# Patient Record
Sex: Male | Born: 1975
Health system: Southern US, Community
[De-identification: ages and names within clinical notes are randomized; demographics above are authoritative.]

## PROBLEM LIST (undated history)

## (undated) DIAGNOSIS — R7302 Impaired glucose tolerance (oral): Secondary | ICD-10-CM

## (undated) DIAGNOSIS — I10 Essential (primary) hypertension: Secondary | ICD-10-CM

## (undated) DIAGNOSIS — E8881 Metabolic syndrome: Secondary | ICD-10-CM

## (undated) DIAGNOSIS — T7840XA Allergy, unspecified, initial encounter: Secondary | ICD-10-CM

## (undated) HISTORY — DX: Allergy, unspecified, initial encounter: T78.40XA

---
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
## 2004-09-18 ENCOUNTER — Ambulatory Visit: Payer: Self-pay | Admitting: Family Medicine

## 2005-02-06 ENCOUNTER — Ambulatory Visit: Payer: Self-pay | Admitting: Family Medicine

## 2005-05-17 ENCOUNTER — Ambulatory Visit: Payer: Self-pay | Admitting: Family Medicine

## 2005-10-15 ENCOUNTER — Ambulatory Visit: Payer: Self-pay | Admitting: Family Medicine

## 2006-07-01 ENCOUNTER — Ambulatory Visit: Payer: Self-pay | Admitting: Family Medicine

## 2006-10-15 ENCOUNTER — Encounter: Payer: Self-pay | Admitting: Family Medicine

## 2006-10-15 DIAGNOSIS — M79609 Pain in unspecified limb: Secondary | ICD-10-CM | POA: Insufficient documentation

## 2006-10-16 ENCOUNTER — Encounter: Admission: RE | Admit: 2006-10-16 | Discharge: 2006-10-16 | Payer: Self-pay | Admitting: Family Medicine

## 2007-05-14 ENCOUNTER — Ambulatory Visit: Payer: Self-pay | Admitting: Family Medicine

## 2007-10-14 ENCOUNTER — Ambulatory Visit: Payer: Self-pay | Admitting: Family Medicine

## 2008-03-09 ENCOUNTER — Ambulatory Visit: Payer: Self-pay | Admitting: Family Medicine

## 2008-03-09 DIAGNOSIS — J309 Allergic rhinitis, unspecified: Secondary | ICD-10-CM | POA: Insufficient documentation

## 2008-03-09 HISTORY — DX: Morbid (severe) obesity due to excess calories: E66.01

## 2008-03-17 ENCOUNTER — Ambulatory Visit: Payer: Self-pay | Admitting: Family Medicine

## 2008-03-17 DIAGNOSIS — R030 Elevated blood-pressure reading, without diagnosis of hypertension: Secondary | ICD-10-CM | POA: Insufficient documentation

## 2008-03-30 ENCOUNTER — Telehealth: Payer: Self-pay | Admitting: Family Medicine
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
## 2009-03-21 ENCOUNTER — Ambulatory Visit: Payer: Self-pay | Admitting: Family Medicine

## 2009-04-27 ENCOUNTER — Ambulatory Visit: Payer: Self-pay | Admitting: Sports Medicine

## 2010-06-13 ENCOUNTER — Encounter: Payer: Self-pay | Admitting: Family Medicine

## 2010-06-22 NOTE — Assessment & Plan Note (Signed)
Summary: Nausea/Diarrhea  Subjective:  Patient complains of onset today of nausea/vomiting and watery diarrhea, fatigue, myalgias, chills.  Able to take fluids.  No abdominal pain.  Assessment:   Suspect viral gastroenteritis  Plan:  Begin clear liquids (Pedialyte to start), then gradually advance. PROMETHAZINE HCL 25 MG TABS (PROMETHAZINE HCL) 1 by mouth q4 to 6hr as needed nausea Clinic visit if symptoms worsen.

## 2010-07-09 ENCOUNTER — Encounter: Payer: Self-pay | Admitting: *Deleted

## 2010-09-25 ENCOUNTER — Encounter: Payer: Self-pay | Admitting: Family Medicine

## 2010-09-25 ENCOUNTER — Ambulatory Visit (INDEPENDENT_AMBULATORY_CARE_PROVIDER_SITE_OTHER): Payer: Self-pay | Admitting: Family Medicine

## 2010-09-25 VITALS — BP 138/87 | HR 96 | Temp 97.7°F | Ht 72.0 in | Wt 247.0 lb

## 2010-09-25 DIAGNOSIS — M549 Dorsalgia, unspecified: Secondary | ICD-10-CM
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
## 2010-09-25 NOTE — Patient Instructions (Signed)
You have musculoskeletal low back pain Take tylenol for baseline pain relief (1-2 extra strength tabs 3x/day) Aleve 1-2 tabs twice a day with food for pain and inflammation (if you do not have stomach or kidney issues). Consider flexeril as needed for muscle spasms at bedtime (no driving on this medicine). Stay as active as possible. Do home exercises and stretches as directed - hold each for 20-30 seconds and do each one three times. Consider massage, chiropractor, physical therapy, and/or acupuncture. Physical therapy has been shown to be helpful while the others have mixed results. Strengthening of low back muscles, abdominal musculature are key for long term pain relief. If not improving, will consider further imaging (x-rays, MRI) and/or other medications (neurontin, lyrica, nortriptyline) that help with pain.

## 2011-05-07 ENCOUNTER — Encounter: Payer: Self-pay | Admitting: Family Medicine

## 2011-05-15 ENCOUNTER — Ambulatory Visit (INDEPENDENT_AMBULATORY_CARE_PROVIDER_SITE_OTHER): Payer: Commercial Managed Care - PPO | Admitting: Family Medicine

## 2011-05-15 ENCOUNTER — Encounter: Payer: Self-pay | Admitting: Family Medicine

## 2011-05-15 VITALS — BP 120/74 | HR 86 | Resp 18 | Ht 70.5 in | Wt 247.1 lb

## 2011-05-15 DIAGNOSIS — R030 Elevated blood-pressure reading, without diagnosis of hypertension: Secondary | ICD-10-CM

## 2011-05-15 DIAGNOSIS — E669 Obesity, unspecified: Secondary | ICD-10-CM

## 2011-05-15 DIAGNOSIS — J309 Allergic rhinitis, unspecified: Secondary | ICD-10-CM

## 2011-05-15 DIAGNOSIS — T7840XA Allergy, unspecified, initial encounter: Secondary | ICD-10-CM

## 2011-05-15 MED ORDER — FLUTICASONE PROPIONATE 50 MCG/ACT NA SUSP: 2.0000 | Freq: Every day | NASAL | Status: DC

## 2011-05-15 NOTE — Patient Instructions (Addendum)
F/U in 6 months. Weight loss goal of 2 pound per month or a little more Fasting CBc, chem 7, lipid, HBa1C and TSH and Vitamin D   It is important that you exercise regularly at least 5 times a week. If you develop chest pain, have severe difficulty breathing, or feel very tired, stop exercising immediately and seek medical attention   A healthy diet is rich in fruit, vegetables and whole grains. Poultry fish, nuts and beans are a healthy choice for protein rather then red meat. A low sodium diet and drinking 64 ounces of water daily is generally recommended. Oils and sweet should be limited. Carbohydrates especially for those who are diabetic or overweight, should be limited to 34-45 gram per meal. It is important to eat on a regular schedule, at least 3 times daily. Snacks should be primarily fruits, vegetables or nuts.   You will be referred to the dietitian  Pls log into "my fitness pal" and use it  Cut out the sweet expect in fresh fruit.   Cultivate a social life.  All the best!!   Sleep goal of 6.5 to 8 hrs each night  Call in for zyrtec. I will precribe flonase.  PLS Fasting labs in the next 3 weeks

## 2011-05-20 NOTE — Assessment & Plan Note (Signed)
Fairly stable. Pt to call in for oral med, zyrtec or claritin, and he will have flonase prescribed

## 2011-05-20 NOTE — Assessment & Plan Note (Signed)
Deteriorated. Patient re-educated about  the importance of commitment to a  minimum of 150 minutes of exercise per week. The importance of healthy food choices with portion control discussed. Encouraged to start a food diary, count calories and to consider  joining a support group. Sample diet sheets offered. Goals set by the patient for the next several months.    

## 2011-05-20 NOTE — Assessment & Plan Note (Signed)
Normotensive at this visit. Pt to commit to DASH diet and regular exercise

## 2011-05-20 NOTE — Progress Notes (Signed)
  Subjective:    Patient ID: Darryl Christensen, male    DOB: 15-Nov-1975, 36 y.o.   MRN: [id number]  HPI The PT is here for follow up and re-evaluation of chronic medical conditions, medication management and review of any available recent lab and radiology data.  Preventive health is updated, specifically  Cancer screening and Immunization.   Questions or concerns regarding consultations or procedures which the PT has had in the interim are  addressed. The PT denies any adverse reactions to current medications since the last visit.  He reports poor work life balance, recently spoke with a life coach and plans to make changes. No regular exercise and poor eating habits have resulted in weight gain. Labs are still outstanding. Allergies are fairly stable though he does need medication       Review of Systems See HPI Denies recent fever or chills. Denies sinus pressure, nasal congestion, ear pain or sore throat. Denies chest congestion, productive cough or wheezing. Denies chest pains, palpitations and leg swelling Denies abdominal pain, nausea, vomiting,diarrhea or constipation.   Denies dysuria, frequency, hesitancy or incontinence. Denies joint pain, swelling and limitation in mobility. Denies headaches, seizures, numbness, or tingling. Denies depression, anxiety or insomnia. Denies skin break down or rash.        Objective:   Physical Exam Patient alert and oriented and in no cardiopulmonary distress.  HEENT: No facial asymmetry, EOMI, no sinus tenderness,  oropharynx pink and moist.  Neck supple no adenopathy.  Chest: Clear to auscultation bilaterally.  CVS: S1, S2 no murmurs, no S3.  ABD: Soft non tender. Bowel sounds normal.  Ext: No edema  MS: Adequate ROM spine, shoulders, hips and knees.  Skin: Intact, no ulcerations or rash noted.  Psych: Good eye contact, normal affect. Memory intact not anxious or depressed appearing.  CNS: CN 2-12 intact, power, tone  and sensation normal throughout.        Assessment & Plan:

## 2011-11-13 ENCOUNTER — Ambulatory Visit: Payer: Commercial Managed Care - PPO | Admitting: Family Medicine

## 2012-01-24 ENCOUNTER — Ambulatory Visit: Payer: Commercial Managed Care - PPO | Admitting: Family Medicine

## 2012-03-20 ENCOUNTER — Ambulatory Visit: Payer: Commercial Managed Care - PPO | Admitting: Family Medicine

## 2012-05-12 ENCOUNTER — Ambulatory Visit: Payer: Commercial Managed Care - PPO | Admitting: Family Medicine

## 2012-06-26 ENCOUNTER — Ambulatory Visit: Payer: Commercial Managed Care - PPO | Admitting: Family Medicine

## 2012-08-19 ENCOUNTER — Ambulatory Visit: Payer: Commercial Managed Care - PPO | Admitting: Family Medicine

## 2012-08-21 ENCOUNTER — Ambulatory Visit: Payer: Commercial Managed Care - PPO | Admitting: Family Medicine

## 2012-09-11 ENCOUNTER — Ambulatory Visit (INDEPENDENT_AMBULATORY_CARE_PROVIDER_SITE_OTHER): Payer: Commercial Managed Care - PPO | Admitting: Family Medicine

## 2012-09-11 ENCOUNTER — Encounter: Payer: Self-pay | Admitting: Family Medicine

## 2012-09-11 VITALS — BP 124/84 | HR 84 | Resp 16 | Ht 70.5 in | Wt 254.0 lb

## 2012-09-11 DIAGNOSIS — Z139 Encounter for screening, unspecified: Secondary | ICD-10-CM

## 2012-09-11 DIAGNOSIS — E669 Obesity, unspecified: Secondary | ICD-10-CM

## 2012-09-11 DIAGNOSIS — T7840XA Allergy, unspecified, initial encounter: Secondary | ICD-10-CM

## 2012-09-11 NOTE — Patient Instructions (Addendum)
F/u in 4 month  Weight loss goal of 4 pounds per month  Please locate a nutritionist of your choice, if you need a referral send me a message We will check [place] for you  Work life balance as well  CBC, lipid, chem 7, TSH, hBA1C  Fasting as soon as possible  It is important that you exercise regularly at least 30 minutes 5 times a week. If you develop chest pain, have severe difficulty breathing, or feel very tired, stop exercising immediately and seek medical attention   Rash on left forearm is where the scab is healing, please stop traumatizing the area

## 2012-09-11 NOTE — Progress Notes (Signed)
  Subjective:    Patient ID: Darryl Christensen, male    DOB: 11/18/1975, 37 y.o.   MRN: [id number]  HPI Pt in for follow up.  States allergies are well controlled with zyrtec Work life balance still does not exist, eating habits are unchanged, with no commitment to exercise.  Believe s he will benefit from a nutritionist helping him, and he has access to dance classes at different work sites which he will look into. Understands the need to be aggressive about lifestyle change to improve his health, has gained 20 pounds in the past approx 5 years   Review of Systems See HPI Denies recent fever or chills. Denies sinus pressure, nasal congestion, ear pain or sore throat. Denies chest congestion, productive cough or wheezing. Denies chest pains, palpitations and leg swelling Denies abdominal pain, nausea, vomiting,diarrhea or constipation.   Denies dysuria, frequency, hesitancy or incontinence. Denies joint pain, swelling and limitation in mobility. Denies headaches, seizures, numbness, or tingling. Denies depression, anxiety or insomnia. C/o hyperpigmented pruritic area on his left forearm which he has been "picking at" Was initially a bite, no purulent drainage from same        Objective:   Physical Exam  Patient alert and oriented and in no cardiopulmonary distress.  HEENT: No facial asymmetry, EOMI,  oropharynx  and moist.  Neck supple no adenopathy.  Chest: Clear to auscultation bilaterally.  CVS: S1, S2 no murmurs, no S3.  ABD: Soft non tender. Bowel sounds normal.  Ext: No edema  MS: Adequate ROM spine, shoulders, hips and knees.  Skin: Intact, hyperpigmented lesion macular rash  left forearm due to repeated trauma  Psych: Good eye contact, normal affect. Memory intact not anxious or depressed appearing.  CNS: CN 2-12 intact, power,  normal throughout.       Assessment & Plan:

## 2012-09-14 NOTE — Assessment & Plan Note (Signed)
Well controlled with zyrtec, continue same

## 2012-09-14 NOTE — Assessment & Plan Note (Signed)
Deteriorated. Patient re-educated about  the importance of commitment to a  minimum of 150 minutes of exercise per week. The importance of healthy food choices with portion control discussed. Encouraged to start a food diary, count calories and to consider  joining a support group. Sample diet sheets offered. Goals set by the patient for the next several months.    

## 2012-11-19 ENCOUNTER — Ambulatory Visit: Payer: Self-pay | Admitting: Family Medicine

## 2012-12-05 ENCOUNTER — Ambulatory Visit: Payer: Self-pay | Admitting: Family Medicine

## 2013-01-22 ENCOUNTER — Ambulatory Visit: Payer: Commercial Managed Care - PPO | Admitting: Family Medicine

## 2013-02-12 ENCOUNTER — Encounter (INDEPENDENT_AMBULATORY_CARE_PROVIDER_SITE_OTHER): Payer: Self-pay

## 2013-02-12 ENCOUNTER — Encounter: Payer: Self-pay | Admitting: Family Medicine

## 2013-02-12 ENCOUNTER — Ambulatory Visit (INDEPENDENT_AMBULATORY_CARE_PROVIDER_SITE_OTHER): Payer: Commercial Managed Care - PPO | Admitting: Family Medicine

## 2013-02-12 VITALS — BP 132/84 | HR 90 | Resp 18 | Ht 70.5 in | Wt 247.0 lb

## 2013-02-12 DIAGNOSIS — M549 Dorsalgia, unspecified: Secondary | ICD-10-CM

## 2013-02-12 DIAGNOSIS — J309 Allergic rhinitis, unspecified: Secondary | ICD-10-CM

## 2013-02-12 DIAGNOSIS — Z139 Encounter for screening, unspecified: Secondary | ICD-10-CM

## 2013-02-12 DIAGNOSIS — E669 Obesity, unspecified: Secondary | ICD-10-CM

## 2013-02-12 NOTE — Progress Notes (Signed)
  Subjective:    Patient ID: Darryl Christensen, male    DOB: 1975/11/17, 37 y.o.   MRN: [id number]  HPI The PT is here for follow up and re-evaluation of chronic medical conditions, medication management and review of any available recent lab and radiology data.  Preventive health is updated, specifically   Immunization. Has had flu vaccine, tetanus status still needs to be verified, feels he got this through employee health  Questions or concerns regarding consultations or procedures which the PT has had in the interim are  Addressed.He has been to nutritionist , and has worked on changing eating habits, as well as commitment to daily physical activity. 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 Also has improved stress management , allocating more duties, relying on relationships he is developing, listening to inspirational talks and music for stress relief. The PT denies any need for chronic meds, including allergy meds.  h/o LBP  To right leg x 4 month, denies numbness or  Weakness in the leg, symptoms are from buttock to calf, denies  or incontinence. Symptoms occur after prolonged sitting eg 1 hour car ride, he does feel that they are less since weight loss, opting to hold on imaging study at this time     Review of Systems See HPI Denies recent fever or chills. Denies sinus pressure, nasal congestion, ear pain or sore throat. Denies chest congestion, productive cough or wheezing. Denies chest pains, palpitations and leg swelling Denies abdominal pain, nausea, vomiting,diarrhea or constipation.   Denies dysuria, frequency, hesitancy or incontinence. Denies joint pain, swelling and limitation in mobility. Denies headaches, seizures,  Denies depression, anxiety or insomnia. Denies skin break down or rash.        Objective:   Physical Exam  Patient alert and oriented and in no cardiopulmonary  distress.  HEENT: No facial asymmetry, EOMI, no sinus tenderness,  oropharynx pink and moist.  Neck supple no adenopathy.  Chest: Clear to auscultation bilaterally.  CVS: S1, S2 no murmurs, no S3.  ABD: Soft non tender.   Ext: No edema  MS: Adequate ROM spine, shoulders, hips and knees.  Skin: Intact, no ulcerations or rash noted.  Psych: Good eye contact, normal affect. Memory intact not anxious or depressed appearing.  CNS: CN 2-12 intact, power, and sensation normal throughout.       Assessment & Plan:

## 2013-02-12 NOTE — Patient Instructions (Addendum)
F/U in  4 month, call if you need me before   It is important that you exercise regularly at least 30 minutes 5 times a week. If you develop chest pain, have severe difficulty breathing, or feel very tired, stop exercising immediately and seek medical attention   Continue to work on weight loss , you are doing very well  CBC, chem 7, lipid, HBA1C, TSH , vit D

## 2013-02-13 ENCOUNTER — Ambulatory Visit: Payer: Self-pay | Admitting: Family Medicine

## 2013-02-14 NOTE — Assessment & Plan Note (Signed)
Improved. Pt applauded on succesful weight loss through lifestyle change, and encouraged to continue same. Weight loss goal set for the next several months.  
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
## 2013-02-14 NOTE — Assessment & Plan Note (Signed)
No current or recent flare, no need for maintenance med at this time

## 2013-03-07 ENCOUNTER — Ambulatory Visit: Payer: Self-pay | Admitting: Family Medicine

## 2013-06-18 ENCOUNTER — Encounter: Payer: Self-pay | Admitting: Family Medicine

## 2013-06-18 ENCOUNTER — Encounter (INDEPENDENT_AMBULATORY_CARE_PROVIDER_SITE_OTHER): Payer: Self-pay

## 2013-06-18 ENCOUNTER — Ambulatory Visit (INDEPENDENT_AMBULATORY_CARE_PROVIDER_SITE_OTHER): Payer: Commercial Managed Care - PPO | Admitting: Family Medicine

## 2013-06-18 VITALS — BP 120/80 | HR 98 | Resp 18 | Ht 70.5 in | Wt 249.0 lb

## 2013-06-18 DIAGNOSIS — F919 Conduct disorder, unspecified: Secondary | ICD-10-CM

## 2013-06-18 DIAGNOSIS — J309 Allergic rhinitis, unspecified: Secondary | ICD-10-CM

## 2013-06-18 DIAGNOSIS — R4689 Other symptoms and signs involving appearance and behavior: Secondary | ICD-10-CM

## 2013-06-18 DIAGNOSIS — E669 Obesity, unspecified: Secondary | ICD-10-CM

## 2013-06-18 MED ORDER — CETIRIZINE HCL 10 MG PO TABS: 10.0000 mg | ORAL_TABLET | Freq: Every day | ORAL | Status: DC

## 2013-06-18 MED ORDER — FLUTICASONE PROPIONATE 50 MCG/ACT NA SUSP: 2.0000 | Freq: Every day | NASAL | Status: DC

## 2013-06-18 NOTE — Patient Instructions (Signed)
F/U in 4 months, call if you need me before  It is important that you exercise regularly at least 30 minutes 7 times a week. If you develop chest pain, have severe difficulty breathing, or feel very tired, stop exercising immediately and seek medical attention    A healthy diet is rich in fruit, vegetables and whole grains. Poultry fish, nuts and beans are a healthy choice for protein rather then red meat. A low sodium diet and drinking 64 ounces of water daily is generally recommended. Oils and sweet should be limited. Carbohydrates especially for those who are diabetic or overweight, should be limited to 45 to 60 gram per meal. It is important to eat on a regular schedule, at least 3 times daily. Snacks should be primarily fruits, vegetables or nuts.  Adequate rest, generally 6 to 8 hours per night is important for good health.Good sleep hygiene involves setting a regular bedtime, and turning off all sound and light in your sleep environment.Limiting caffeine intake will also help with the ability to rest well  LABS this week  Congrats on weight loss, ad please count calories   All medications need to be brought to every visit  Allergy meds are sent in  Definitely plan 1 week vacations and take them , and also once weekly fun times

## 2013-06-21 DIAGNOSIS — R4689 Other symptoms and signs involving appearance and behavior: Secondary | ICD-10-CM | POA: Insufficient documentation

## 2013-06-21 NOTE — Assessment & Plan Note (Signed)
Labs have been requested for over 1 year, still not done. Pt states he will have them done in next week. Needs them, I suspect he is likely prediabetic with metabolic syndrome , and this needs to be identified and dealt with in aggressive fashion sooner rather than later Will send a personal reminder of importance of labs

## 2013-06-21 NOTE — Assessment & Plan Note (Signed)
Currently becoming symptomatic, meds prescribed

## 2013-06-21 NOTE — Progress Notes (Signed)
   Subjective:    Patient ID: Darryl Christensen, male    DOB: 07/13/1975, 38 y.o.   MRN: [id number]  HPI The PT is here for follow up and re-evaluation of chronic medical conditions, medication management and review of agree ny available recent lab and radiology data. Labs are still not done! Preventive health is updated, specifically  Cancer screening and Immunization.  Reports having had his TdaP through employee health , is to get back to me with the info Questions or concerns regarding consultations or procedures which the PT has had in the interim are  Addressed.Has enjoyed sessions with nutritionist, was counting carbs and is still counting steps with a pedometer, aims for 1000 per day, sometimes falls short. Still working on work [employer]/life balance, not much time off or social life, hte good thing is that he absolutely enjoys his work , but does agree that he needs to relax more The PT denies any adverse reactions to current medications since the last visit.  There are no new concerns.  There are no specific complaints increased nasal stuffiness and post nasal drip in past 2 days, no fever or chills, has seasonal allergies , generally in the Spring, but temp differences during this Winter  is playing its part also Back and lower leg pain has resolved     Review of Systems See HPI Denies recent fever or chills. Denies sore throat , ear pain  Denies chest congestion, productive cough or wheezing. Denies chest pains, palpitations and leg swelling Denies abdominal pain, nausea, vomiting,diarrhea or constipation.   Denies dysuria, frequency, hesitancy or incontinence. Denies joint pain, swelling and limitation in mobility. Denies headaches, seizures, numbness, or tingling. Denies depression, anxiety or insomnia. Denies skin break down or rash.        Objective:   Physical Exam  Patient alert and oriented and in no cardiopulmonary distress.  HEENT: No facial asymmetry, EOMI, no  sinus tenderness,  oropharynx pink and moist.  Neck supple no adenopathy.  Chest: Clear to auscultation bilaterally.  CVS: S1, S2 no murmurs, no S3.  ABD: Soft non tender. Bowel sounds normal.  Ext: No edema  MS: Adequate ROM spine, shoulders, hips and knees.  Skin: Intact, no ulcerations or rash noted.  Psych: Good eye contact, normal affect. Memory intact not anxious or depressed appearing.  CNS: CN 2-12 intact, power, tone and sensation normal throughout.       Assessment & Plan:  Obesity (BMI 30.0-34.9) Deteriorated since last visit , but improved since May 2014. May need appetite suppressant, will offer after labs are obtained Patient re-educated about  the importance of commitment to a  minimum of 150 minutes of exercise per week. The importance of healthy food choices with portion control discussed. Encouraged to start a food diary, count calories and to consider  joining a support group. Sample diet sheets offered. Goals set by the patient for the next several months.     Allergic rhinitis Currently becoming symptomatic, meds prescribed  Non-compliant behavior Labs have been requested for over 1 year, still not done. Pt states he will have them done in next week. Needs them, I suspect he is likely prediabetic with metabolic syndrome , and this needs to be identified and dealt with in aggressive fashion sooner rather than later Will send a personal reminder of importance of labs

## 2013-06-21 NOTE — Assessment & Plan Note (Addendum)
Deteriorated since last visit , but improved since May 2014. May need appetite suppressant, will offer after labs are obtained Patient re-educated about  the importance of commitment to a  minimum of 150 minutes of exercise per week. The importance of healthy food choices with portion control discussed. Encouraged to start a food diary, count calories and to consider  joining a support group. Sample diet sheets offered. Goals set by the patient for the next several months. Has been doing better ,  As far as attempts to focus on lifestyle modification, really would benefit from personal coach and I may need to also offer this as another management strategy, more frequest visits to nutritionist or in the office if he starts appetite suppressant

## 2013-07-03 ENCOUNTER — Ambulatory Visit: Payer: Self-pay | Admitting: Family Medicine

## 2013-07-05 ENCOUNTER — Ambulatory Visit: Payer: Self-pay | Admitting: Family Medicine

## 2013-10-15 ENCOUNTER — Ambulatory Visit: Payer: Self-pay | Admitting: Family Medicine

## 2013-11-04 ENCOUNTER — Ambulatory Visit: Payer: Self-pay | Admitting: Family Medicine

## 2013-11-12 ENCOUNTER — Ambulatory Visit: Payer: Commercial Managed Care - PPO | Admitting: Family Medicine

## 2013-12-30 ENCOUNTER — Ambulatory Visit: Payer: Commercial Managed Care - PPO | Admitting: Family Medicine

## 2014-01-06 ENCOUNTER — Ambulatory Visit: Payer: Self-pay | Admitting: Family Medicine

## 2014-02-03 ENCOUNTER — Ambulatory Visit: Payer: Commercial Managed Care - PPO | Admitting: Family Medicine

## 2014-02-04 ENCOUNTER — Ambulatory Visit: Payer: Self-pay | Admitting: Family Medicine

## 2014-02-22 ENCOUNTER — Ambulatory Visit: Payer: Commercial Managed Care - PPO | Admitting: Family Medicine

## 2014-04-06 ENCOUNTER — Ambulatory Visit: Payer: Self-pay | Admitting: Family Medicine

## 2014-05-07 ENCOUNTER — Ambulatory Visit: Payer: Self-pay | Admitting: Family Medicine

## 2014-07-07 ENCOUNTER — Ambulatory Visit
Admit: 2014-07-07 | Disposition: A | Discharge status: Home / Self Care | Payer: Self-pay | Attending: Family Medicine | Admitting: Family Medicine

## 2014-08-06 ENCOUNTER — Ambulatory Visit
Admit: 2014-08-06 | Disposition: A | Discharge status: Home / Self Care | Payer: Self-pay | Attending: Family Medicine | Admitting: Family Medicine

## 2014-09-24 ENCOUNTER — Ambulatory Visit: Payer: Commercial Managed Care - PPO | Admitting: Dietician

## 2016-03-13 DIAGNOSIS — H524 Presbyopia: Secondary | ICD-10-CM | POA: Diagnosis not present

## 2016-03-13 DIAGNOSIS — H33301 Unspecified retinal break, right eye: Secondary | ICD-10-CM | POA: Diagnosis not present

## 2016-03-13 DIAGNOSIS — H52223 Regular astigmatism, bilateral: Secondary | ICD-10-CM | POA: Diagnosis not present

## 2016-03-13 DIAGNOSIS — H5213 Myopia, bilateral: Secondary | ICD-10-CM | POA: Diagnosis not present

## 2016-03-13 DIAGNOSIS — H1045 Other chronic allergic conjunctivitis: Secondary | ICD-10-CM | POA: Diagnosis not present

## 2016-03-13 DIAGNOSIS — H16223 Keratoconjunctivitis sicca, not specified as Sjogren's, bilateral: Secondary | ICD-10-CM | POA: Diagnosis not present

## 2016-04-06 ENCOUNTER — Encounter: Payer: Commercial Managed Care - PPO | Attending: Family Medicine | Admitting: Dietician

## 2016-04-06 VITALS — Ht 71.0 in | Wt 255.8 lb

## 2016-04-06 DIAGNOSIS — Z6835 Body mass index (BMI) 35.0-35.9, adult: Secondary | ICD-10-CM

## 2016-04-06 DIAGNOSIS — E6609 Other obesity due to excess calories: Secondary | ICD-10-CM

## 2016-04-06 DIAGNOSIS — E669 Obesity, unspecified: Secondary | ICD-10-CM | POA: Diagnosis not present

## 2016-04-06 DIAGNOSIS — Z713 Dietary counseling and surveillance: Secondary | ICD-10-CM | POA: Diagnosis not present

## 2016-04-06 NOTE — Progress Notes (Signed)
Notes from [facility] [facility] employee "self referral" nutrition session: Start time: 0900   End time: 1000  Met with employee to discuss his/her nutritional concerns and diet history.  Current eating pattern: Breakfast yogurt with granola; milkshake Lunch  Sandwich, chips Supper varies, often out Snacks often throughout the day   The employee's questions/concerns were also addressed. He seeks help with controlling snack choices and frequency, healthier breakfast options, overall more mindful eating.   We discussed the following topics:  Healthy Eating-- snack options, balanced nutrition for meals, breakfast ideas, importance of including protein source in smoothies, viewed "meal in a jar" websites for portable meal options.   Weight Concerns--reviewed benefits of tracking food intake, mindful vs. Mindless eating   I also provided the following handouts as reinforcement of the educational session: Build A Balanced Breakfast   Additional Comments: Will send additional recipes to patient by email. Will follow-up by email in about 1 month.     Goals Agreed Upon: Patient will reduce frequency of snacking and make lower-calorie, more nutritious choices. He will plan to resume tracking his food intake. He will eat balanced breakfast meals.

## 2016-04-09 ENCOUNTER — Encounter: Payer: Self-pay | Admitting: Dietician

## 2017-07-02 ENCOUNTER — Telehealth: Payer: Self-pay | Admitting: Family Medicine

## 2017-07-02 DIAGNOSIS — Z13220 Encounter for screening for lipoid disorders: Secondary | ICD-10-CM

## 2017-07-02 DIAGNOSIS — Z1159 Encounter for screening for other viral diseases: Secondary | ICD-10-CM

## 2017-07-02 DIAGNOSIS — E669 Obesity, unspecified: Secondary | ICD-10-CM

## 2017-07-02 NOTE — Telephone Encounter (Signed)
Has not been here since 06/2013 but requesting appt tomorrow. I didn't know how to handle the situation so I am forwarding to you.

## 2017-07-02 NOTE — Telephone Encounter (Signed)
Patient is requesting an appointment tomorrow with Dr.Simpson if possible. Cb#: [phone]

## 2017-07-03 NOTE — Telephone Encounter (Signed)
Pls order HIV, CBC, fasting lipid, cmp and EGFR, and TSH and vit D, needs them before he comes in, appt is being scheduled

## 2017-07-03 NOTE — Telephone Encounter (Signed)
What labs does he need for his visit?

## 2017-07-03 NOTE — Telephone Encounter (Signed)
Ordered

## 2017-07-15 ENCOUNTER — Encounter: Payer: Commercial Managed Care - PPO | Admitting: Family Medicine

## 2017-07-31 ENCOUNTER — Ambulatory Visit: Payer: 59 | Admitting: Family Medicine

## 2017-07-31 ENCOUNTER — Encounter: Payer: Self-pay | Admitting: Family Medicine

## 2017-07-31 ENCOUNTER — Ambulatory Visit (INDEPENDENT_AMBULATORY_CARE_PROVIDER_SITE_OTHER): Payer: 59 | Admitting: Family Medicine

## 2017-07-31 VITALS — BP 142/92 | HR 71 | Resp 16 | Ht 71.0 in | Wt 262.0 lb

## 2017-07-31 DIAGNOSIS — E669 Obesity, unspecified: Secondary | ICD-10-CM | POA: Diagnosis not present

## 2017-07-31 DIAGNOSIS — E785 Hyperlipidemia, unspecified: Secondary | ICD-10-CM | POA: Diagnosis not present

## 2017-07-31 DIAGNOSIS — Z23 Encounter for immunization: Secondary | ICD-10-CM | POA: Diagnosis not present

## 2017-07-31 DIAGNOSIS — J302 Other seasonal allergic rhinitis: Secondary | ICD-10-CM

## 2017-07-31 DIAGNOSIS — R030 Elevated blood-pressure reading, without diagnosis of hypertension: Secondary | ICD-10-CM

## 2017-07-31 DIAGNOSIS — Z1159 Encounter for screening for other viral diseases: Secondary | ICD-10-CM | POA: Diagnosis not present

## 2017-07-31 DIAGNOSIS — Z13220 Encounter for screening for lipoid disorders: Secondary | ICD-10-CM | POA: Diagnosis not present

## 2017-07-31 NOTE — Patient Instructions (Addendum)
Annual exam in 3 months, call if you need me before  Blood pressure high today, losing weight can lower  Blood pressure, it is 142/90 today Normal is less than 120/80  Change in behavior needed so that you can ENJOY  Improved health and aLL the very good things in life that you deserve and are in your lap!  YOU CAN DO ALL OF THIS and we are here to help you!!!  PLEASE SIGN up with my chart today!!  Please get labs today   Please start enjoying music and dance classes nearby regularly  Turn off work phone when not working and ABSOLUTELY plan and enjoy down times  You are referred to nutritionist for help with meal plans to help with weight loss     Hypertension Hypertension is another name for high blood pressure. High blood pressure forces your heart to work harder to pump blood. This can cause problems over time. There are two numbers in a blood pressure reading. There is a top number (systolic) over a bottom number (diastolic). It is best to have a blood pressure below 120/80. Healthy choices can help lower your blood pressure. You may need medicine to help lower your blood pressure if:  Your blood pressure cannot be lowered with healthy choices.  Your blood pressure is higher than 130/80.  Follow these instructions at home: Eating and drinking  If directed, follow the DASH eating plan. This diet includes: ? Filling half of your plate at each meal with fruits and vegetables. ? Filling one quarter of your plate at each meal with whole grains. Whole grains include whole wheat pasta, brown rice, and whole grain bread. ? Eating or drinking low-fat dairy products, such as skim milk or low-fat yogurt. ? Filling one quarter of your plate at each meal with low-fat (lean) proteins. Low-fat proteins include fish, skinless chicken, eggs, beans, and tofu. ? Avoiding fatty meat, cured and processed meat, or chicken with skin. ? Avoiding premade or processed food.  Eat less than 1,500  mg of salt (sodium) a day.  Limit alcohol use to no more than 1 drink a day for nonpregnant women and 2 drinks a day for men. One drink equals 12 oz of beer, 5 oz of wine, or 1 oz of hard liquor. Lifestyle  Work with your doctor to stay at a healthy weight or to lose weight. Ask your doctor what the best weight is for you.  Get at least 30 minutes of exercise that causes your heart to beat faster (aerobic exercise) most days of the week. This may include walking, swimming, or biking.  Get at least 30 minutes of exercise that strengthens your muscles (resistance exercise) at least 3 days a week. This may include lifting weights or pilates.  Do not use any products that contain nicotine or tobacco. This includes cigarettes and e-cigarettes. If you need help quitting, ask your doctor.  Check your blood pressure at home as told by your doctor.  Keep all follow-up visits as told by your doctor. This is important. Medicines  Take over-the-counter and prescription medicines only as told by your doctor. Follow directions carefully.  Do not skip doses of blood pressure medicine. The medicine does not work as well if you skip doses. Skipping doses also puts you at risk for problems.  Ask your doctor about side effects or reactions to medicines that you should watch for. Contact a doctor if:  You think you are having a reaction to the medicine  you are taking.  You have headaches that keep coming back (recurring).  You feel dizzy.  You have swelling in your ankles.  You have trouble with your vision. Get help right away if:  You get a very bad headache.  You start to feel confused.  You feel weak or numb.  You feel faint.  You get very bad pain in your: ? Chest. ? Belly (abdomen).  You throw up (vomit) more than once.  You have trouble breathing. Summary  Hypertension is another name for high blood pressure.  Making healthy choices can help lower blood pressure. If your  blood pressure cannot be controlled with healthy choices, you may need to take medicine. This information is not intended to replace advice given to you by your health care provider. Make sure you discuss any questions you have with your health care provider. Document Released: 10/10/2007 Document Revised: 03/21/2016 Document Reviewed: 03/21/2016 Elsevier Interactive Patient Education  [employer].

## 2017-08-01 DIAGNOSIS — Z23 Encounter for immunization: Secondary | ICD-10-CM | POA: Diagnosis not present

## 2017-08-01 DIAGNOSIS — E785 Hyperlipidemia, unspecified: Secondary | ICD-10-CM | POA: Diagnosis not present

## 2017-08-01 DIAGNOSIS — J302 Other seasonal allergic rhinitis: Secondary | ICD-10-CM | POA: Diagnosis not present

## 2017-08-01 DIAGNOSIS — R030 Elevated blood-pressure reading, without diagnosis of hypertension: Secondary | ICD-10-CM | POA: Diagnosis not present

## 2017-08-01 DIAGNOSIS — E669 Obesity, unspecified: Secondary | ICD-10-CM | POA: Diagnosis not present

## 2017-08-02 ENCOUNTER — Encounter: Payer: Self-pay | Admitting: Family Medicine

## 2017-08-04 ENCOUNTER — Encounter: Payer: Self-pay | Admitting: Family Medicine

## 2017-08-04 DIAGNOSIS — E785 Hyperlipidemia, unspecified: Secondary | ICD-10-CM | POA: Insufficient documentation

## 2017-08-04 DIAGNOSIS — I10 Essential (primary) hypertension: Secondary | ICD-10-CM

## 2017-08-04 DIAGNOSIS — Z23 Encounter for immunization: Secondary | ICD-10-CM | POA: Insufficient documentation

## 2017-08-04 HISTORY — DX: Essential (primary) hypertension: I10

## 2017-08-04 NOTE — Assessment & Plan Note (Signed)
Hyperlipidemia:Low fat diet discussed and encouraged.   Lipid Panel  Lab Results  Component Value Date   CHOL 194 07/31/2017   HDL 44 07/31/2017   LDLCALC 127 (H) 07/31/2017   TRIG 118 07/31/2017   CHOLHDL 4.4 07/31/2017

## 2017-08-04 NOTE — Assessment & Plan Note (Signed)
No current flare 

## 2017-08-04 NOTE — Progress Notes (Signed)
Darryl Christensen     MRN: [id number]      DOB: 08/31/1975   HPI Darryl Christensen is hereto re establish care, last seen just under 3 years ago, says "It's time!" C/o weight gain and poor eating habits with insufficient exerciseds Works too much although he absolutely enjoys it, but he also enjoys winding down and having fun, and has not allowed himself sufficient time for this. 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 Does not smoke and never has . Needs TdAP and is now at age 42 , willing to "take the plunge " and even understands the need to get labs today! Wishes to be referred to nutritionist whop he worked well with in the past Does report getting elevated BP reading in the supermarket, which lowered after 3rd reading when he was " less anxious" bu were still above the normal range  ROS Denies recent fever or chills. Denies sinus pressure, nasal congestion, ear pain or sore throat.Does have a h/o seasonal allergies but not experiencing a flare currently  Denies chest congestion, productive cough or wheezing. Denies chest pains, palpitations and leg swelling Denies abdominal pain, nausea, vomiting,diarrhea or constipation. No change in stool  Denies dysuria, frequency, hesitancy or incontinence. Denies joint pain, swelling and limitation in mobility. Denies headaches, seizures, numbness, or tingling. Denies depression, anxiety or insomnia. Denies skin break down c/o few hyperpigmented lesions on his forearm where he has been picking/ scratching his skin, wonders if he is diabetic   PE  BP (!) 142/92   Pulse 71   Resp 16   Ht 5\' 11"   (1.803 m)   Wt 262 lb (118.8 kg)   SpO2 98%   BMI 36.54 kg/m   Patient alert and oriented and in no cardiopulmonary distress.  HEENT: No facial asymmetry, EOMI,   oropharynx pink and moist.  Neck supple no JVD, no mass.  Chest: Clear to auscultation bilaterally.  CVS: S1, S2 no murmurs, no S3.Regular rate.  ABD: Soft non tender.   Ext: No edema  MS: Adequate ROM spine, shoulders, hips and knees.  Skin: Intact, no ulcerations hyperpigmented macular annualr rashes noted on right forearm from trauma  Psych: Good eye contact, normal affect. Memory intact not anxious or depressed appearing.  CNS: CN 2-12 intact, power,  normal throughout.no focal deficits noted.   Assessment & Plan  Obesity (BMI 30.0-34.9) Deteriorated. Patient re-educated about  the importance of commitment to a  minimum of 150 minutes of exercise per week.  The importance of healthy food choices with portion control discussed. Referred to nutrition educatoir Goals set by the patient for the next several months.   Weight /BMI 07/31/2017 04/06/2016 06/18/2013  WEIGHT 262 lb 255 lb 12.8 oz 249 lb 0.6 oz  HEIGHT 5\' 11"  5\' 11"  5' 10.5"  BMI 36.54 kg/m2 35.68 kg/m2 35.22 kg/m2      Elevated blood pressure reading DASH diet and commitment to daily physical activity for a minimum of 30 minutes discussed and encouraged, as a part of hypertension management. The importance of attaining a healthy weight is also discussed. Re evaluate in 8 weeks  BP/Weight 07/31/2017 04/06/2016 06/18/2013 02/12/2013 09/11/2012 05/15/2011 09/25/2010  Systolic BP 142 - 120 132 124 161120 138  Diastolic BP 92 - 80 84 84 74 87  Wt. (Lbs) 262 255.8 249.04 247 254 247.06 247  BMI 36.54 35.68 35.22 34.93 35.92 34.94 33.49       Allergic rhinitis No current flare  Need for Tdap vaccination After obtaining informed consent, the vaccine is  administered by LPN.   Dyslipidemia, goal LDL below 100 Hyperlipidemia:Low fat diet discussed and  encouraged.   Lipid Panel  Lab Results  Component Value Date   CHOL 194 07/31/2017   HDL 44 07/31/2017   LDLCALC 127 (H) 07/31/2017   TRIG 118 07/31/2017   CHOLHDL 4.4 07/31/2017

## 2017-08-04 NOTE — Assessment & Plan Note (Signed)
After obtaining informed consent, the vaccine is  administered by LPN.  

## 2017-08-04 NOTE — Assessment & Plan Note (Addendum)
Deteriorated. Patient re-educated about  the importance of commitment to a  minimum of 150 minutes of exercise per week.  The importance of healthy food choices with portion control discussed. Referred to nutrition educatoir Goals set by the patient for the next several months.   Weight /BMI 07/31/2017 04/06/2016 06/18/2013  WEIGHT 262 lb 255 lb 12.8 oz 249 lb 0.6 oz  HEIGHT 5\' 11"  5\' 11"  5' 10.5"  BMI 36.54 kg/m2 35.68 kg/m2 35.22 kg/m2

## 2017-08-04 NOTE — Assessment & Plan Note (Signed)
DASH diet and commitment to daily physical activity for a minimum of 30 minutes discussed and encouraged, as a part of hypertension management. The importance of attaining a healthy weight is also discussed. Re evaluate in 8 weeks  BP/Weight 07/31/2017 04/06/2016 06/18/2013 02/12/2013 09/11/2012 05/15/2011 09/25/2010  Systolic BP 142 - 120 132 124 161120 138  Diastolic BP 92 - 80 84 84 74 87  Wt. (Lbs) 262 255.8 249.04 247 254 247.06 247  BMI 36.54 35.68 35.22 34.93 35.92 34.94 33.49

## 2017-08-06 ENCOUNTER — Encounter: Payer: Self-pay | Admitting: Family Medicine

## 2017-08-06 LAB — HIV ANTIBODY (ROUTINE TESTING W REFLEX): HIV 1&2 Ab, 4th Generation: NONREACTIVE

## 2017-08-06 LAB — LIPID PANEL
Cholesterol: 194 mg/dL (ref <200)
HDL: 44 mg/dL (ref >40)
LDL CHOLESTEROL (CALC): 127 mg/dL — AB
NON-HDL CHOLESTEROL (CALC): 150 mg/dL — AB (ref <130)
TRIGLYCERIDES: 118 mg/dL (ref <150)
Total CHOL/HDL Ratio: 4.4 (calc) (ref <5.0)

## 2017-08-06 LAB — CBC
HEMATOCRIT: 42.8 % (ref 38.5–50.0)
Hemoglobin: 14.9 g/dL (ref 13.2–17.1)
MCH: 30.5 pg (ref 27.0–33.0)
MCHC: 34.8 g/dL (ref 32.0–36.0)
MCV: 87.5 fL (ref 80.0–100.0)
MPV: 11.6 fL (ref 7.5–12.5)
Platelets: 239 10*3/uL (ref 140–400)
RBC: 4.89 10*6/uL (ref 4.20–5.80)
RDW: 13 % (ref 11.0–15.0)
WBC: 5.6 10*3/uL (ref 3.8–10.8)

## 2017-08-06 LAB — COMPLETE METABOLIC PANEL WITH GFR
AG Ratio: 1.2 (calc) (ref 1.0–2.5)
ALKALINE PHOSPHATASE (APISO): 69 U/L (ref 40–115)
ALT: 36 U/L (ref 9–46)
AST: 23 U/L (ref 10–40)
Albumin: 4.4 g/dL (ref 3.6–5.1)
BUN: 11 mg/dL (ref 7–25)
CHLORIDE: 101 mmol/L (ref 98–110)
CO2: 31 mmol/L (ref 20–32)
CREATININE: 1.09 mg/dL (ref 0.60–1.35)
Calcium: 9.7 mg/dL (ref 8.6–10.3)
GFR, Est African American: 98 mL/min/{1.73_m2} (ref >=60)
GFR, Est Non African American: 84 mL/min/{1.73_m2} (ref >=60)
GLUCOSE: 94 mg/dL (ref 65–139)
Globulin: 3.8 g/dL (calc) — ABNORMAL HIGH (ref 1.9–3.7)
Potassium: 4.1 mmol/L (ref 3.5–5.3)
Sodium: 139 mmol/L (ref 135–146)
Total Bilirubin: 0.5 mg/dL (ref 0.2–1.2)
Total Protein: 8.2 g/dL — ABNORMAL HIGH (ref 6.1–8.1)

## 2017-08-06 LAB — HEMOGLOBIN A1C W/OUT EAG: Hgb A1c MFr Bld: 5.8 % of total Hgb — ABNORMAL HIGH (ref <5.7)

## 2017-08-06 LAB — VITAMIN D 25 HYDROXY (VIT D DEFICIENCY, FRACTURES): VIT D 25 HYDROXY: 27 ng/mL — AB (ref 30–100)

## 2017-08-06 LAB — TSH: TSH: 1.18 mIU/L (ref 0.40–4.50)

## 2017-08-29 DIAGNOSIS — H5213 Myopia, bilateral: Secondary | ICD-10-CM | POA: Diagnosis not present

## 2017-10-30 ENCOUNTER — Encounter: Payer: Self-pay | Admitting: Family Medicine

## 2017-10-30 ENCOUNTER — Other Ambulatory Visit: Payer: Self-pay

## 2017-10-30 ENCOUNTER — Ambulatory Visit (INDEPENDENT_AMBULATORY_CARE_PROVIDER_SITE_OTHER): Payer: 59 | Admitting: Family Medicine

## 2017-10-30 VITALS — BP 142/90 | HR 88 | Ht 72.0 in | Wt 257.1 lb

## 2017-10-30 DIAGNOSIS — Z6834 Body mass index (BMI) 34.0-34.9, adult: Secondary | ICD-10-CM

## 2017-10-30 DIAGNOSIS — R7302 Impaired glucose tolerance (oral): Secondary | ICD-10-CM

## 2017-10-30 DIAGNOSIS — B369 Superficial mycosis, unspecified: Secondary | ICD-10-CM | POA: Diagnosis not present

## 2017-10-30 DIAGNOSIS — R030 Elevated blood-pressure reading, without diagnosis of hypertension: Secondary | ICD-10-CM

## 2017-10-30 DIAGNOSIS — Z0000 Encounter for general adult medical examination without abnormal findings: Secondary | ICD-10-CM

## 2017-10-30 DIAGNOSIS — E559 Vitamin D deficiency, unspecified: Secondary | ICD-10-CM | POA: Diagnosis not present

## 2017-10-30 DIAGNOSIS — J309 Allergic rhinitis, unspecified: Secondary | ICD-10-CM | POA: Diagnosis not present

## 2017-10-30 MED ORDER — FLUTICASONE PROPIONATE 50 MCG/ACT NA SUSP: NASAL | 1 refills | Status: AC

## 2017-10-30 MED ORDER — CLOTRIMAZOLE-BETAMETHASONE 1-0.05 % EX CREA: 1.0000 "application " | TOPICAL_CREAM | Freq: Two times a day (BID) | CUTANEOUS | 1 refills | Status: AC

## 2017-10-30 NOTE — Patient Instructions (Addendum)
F/U in 6 months, call if you need me sooner  CONGRATS on 5  pound weight loss  PLEASE continue lifestyle changes that you are making for healthy lifestyle.  Goal of 239 pounds, normal blood pressure, blood sugar and cholesterol by the time you get back, you cAN do this!   All the best!!!

## 2017-11-02 ENCOUNTER — Encounter: Payer: Self-pay | Admitting: Family Medicine

## 2017-11-02 DIAGNOSIS — B369 Superficial mycosis, unspecified: Secondary | ICD-10-CM | POA: Insufficient documentation

## 2017-11-02 DIAGNOSIS — R7302 Impaired glucose tolerance (oral): Secondary | ICD-10-CM

## 2017-11-02 DIAGNOSIS — Z0000 Encounter for general adult medical examination without abnormal findings: Secondary | ICD-10-CM | POA: Insufficient documentation

## 2017-11-02 DIAGNOSIS — E559 Vitamin D deficiency, unspecified: Secondary | ICD-10-CM | POA: Insufficient documentation

## 2017-11-02 HISTORY — DX: Impaired glucose tolerance (oral): R73.02

## 2017-11-02 NOTE — Assessment & Plan Note (Signed)
Increased symptoms in the Summer months, flonase prescribed pt encouraged to use daily as he will be outside exercising more frequently

## 2017-11-02 NOTE — Assessment & Plan Note (Signed)
2nd occurrence of elevated reading. If this occurs at 32rd visit, pt will be labeled as hypertensive and will be managed with medication. He has a weight loss goal of 19 pounds over the next 5 months DASH diet and commitment to daily physical activity for a minimum of 30 minutes discussed and encouraged, as a part of hypertension management. The importance of attaining a healthy weight is also discussed.  BP/Weight 10/30/2017 07/31/2017 04/06/2016 06/18/2013 02/12/2013 09/11/2012 05/15/2011  Systolic BP 142 142 - 120 132 161124 120  Diastolic BP 90 92 - 80 84 84 74  Wt. (Lbs) 257.08 262 255.8 249.04 247 254 247.06  BMI 34.87 36.54 35.68 35.22 34.93 35.92 34.94

## 2017-11-02 NOTE — Assessment & Plan Note (Signed)
Patient educated about the importance of limiting  Carbohydrate intake , the need to commit to daily physical activity for a minimum of 30 minutes , and to commit weight loss. The fact that changes in all these areas will reduce or eliminate all together the development of diabetes is stressed.   Diabetic Labs Latest Ref Rng & Units 07/31/2017  HbA1c <5.7 % of total Hgb 5.8(H)  Chol <200 mg/dL 098194  HDL >11>40 mg/dL 44  Calc LDL mg/dL (calc) 914(N127(H)  Triglycerides <150 mg/dL 829118  Creatinine 5.620.60 - 1.35 mg/dL 1.301.09   BP/Weight 8/65/78466/26/2019 07/31/2017 04/06/2016 06/18/2013 02/12/2013 09/11/2012 05/15/2011  Systolic BP 142 142 - 120 132 962124 120  Diastolic BP 90 92 - 80 84 84 74  Wt. (Lbs) 257.08 262 255.8 249.04 247 254 247.06  BMI 34.87 36.54 35.68 35.22 34.93 35.92 34.94   No flowsheet data found.

## 2017-11-02 NOTE — Assessment & Plan Note (Signed)
Annual exam as documented. Counseling done  re healthy lifestyle involving commitment to 150 minutes exercise per week, heart healthy diet, and attaining healthy weight.The importance of adequate sleep also discussed. Changes in health habits are decided on by the patient with goals and time frames  set for achieving them. Immunization and cancer screening needs are specifically addressed at this visit. 

## 2017-11-02 NOTE — Assessment & Plan Note (Signed)
Advised to take OTC vit D3 2000 IU daily

## 2017-11-02 NOTE — Assessment & Plan Note (Signed)
Affected area is under both breasts, clotrimazole/ betamethasone creams topically twice daly for 7 to 10 days, then, as needed

## 2017-11-02 NOTE — Assessment & Plan Note (Signed)
Improved Patient re-educated about  the importance of commitment to a  minimum of 150 minutes of exercise per week.  The importance of healthy food choices with portion control discussed. Encouraged to start a food diary, count calories and to consider  joining a support group. Sample diet sheets offered. Goals set by the patient for the next several months.   Weight /BMI 10/30/2017 07/31/2017 04/06/2016  WEIGHT 257 lb 1.3 oz 262 lb 255 lb 12.8 oz  HEIGHT 6\' 0"  5\' 11"  5\' 11"   BMI 34.87 kg/m2 36.54 kg/m2 35.68 kg/m2

## 2017-11-02 NOTE — Progress Notes (Signed)
Darryl Christensen     MRN: [id number]      DOB: Jun 17, 1975   HPI: Patient is in for annual physical exam. C/o seasonal allergies , somewhat improved, using OTC oral meds , but will benefit from nasal spray and requests same. Has been focusing on lifestyle changes for improved work/ life balance, feels better , improved outlook with more down time/ me time. Working on [employer] choice with more activity. Encouraged to join weight watchers  To help with food accountability, he is very interested. Working on weight loss and is celebrating a 5 pound weight loss, he has also started moving more For the 2nd time blood pressure is elevated and this is a challenge, will be given the next 5 months to normalize blood pressure through weight loss and lifestyle change , or else he will be labeled hypertensive and require medication, he understands this clearly Education is given and literature provided. C/o itchy rash und breasts where he is sweating excessively Recent labs,  are reviewed.in person , although I had already sent a "my chart" message which he had reviewed Immunization is reviewed , and  updated if needed. Has questions about cancer screening for his age which I will get back to him on specifically, colon cancer screening will start at 55 , and PSA testing can start at 38 with annual intervals so will be done with next lab  PE; BP (!) 142/90 (BP Location: Left Arm, Patient Position: Sitting, Cuff Size: Large)   Pulse 88   Ht 6' (1.829 m)   Wt 257 lb 1.3 oz (116.6 kg)   SpO2 97%   BMI 34.87 kg/m   Pleasant male, alert and oriented x 3, in no cardio-pulmonary distress. Afebrile. HEENT No facial trauma or asymetry. Sinuses non tender. EOMI, External ears normal, tympanic membranes clear. Oropharynx moist, no exudate.Good dentiotion Neck: supple, no adenopathy,JVD or thyromegaly.No bruits.  Chest: Clear to ascultation bilaterally.No crackles or wheezes. Non tender to  palpation  Breast: No asymetry,no masses. No nipple discharge or inversion. No axillary or supraclavicular adenopathy  Cardiovascular system; Heart sounds normal,  S1 and  S2 ,no S3.  No murmur, or thrill. Apical beat not displaced Peripheral pulses normal.  Abdomen: Soft, non tender, no organomegaly or masses. No bruits. Bowel sounds normal. No guarding, tenderness or rebound.  Rectal:  Not examined  Musculoskeletal exam: Full ROM of spine, hips , shoulders and knees. No deformity ,swelling or crepitus noted. No muscle wasting or atrophy.   Neurologic: Cranial nerves 2 to 12 intact. Power, tone ,sensation and reflexes normal throughout. No disturbance in gait. No tremor.  Skin: Intact, no ulceration, , scaling  rash noted.under breasts   Psych; Normal mood and affect. Judgement and concentration normal   Assessment & Plan:  Annual physical exam Annual exam as documented. Counseling done  re healthy lifestyle involving commitment to 150 minutes exercise per week, heart healthy diet, and attaining healthy weight.The importance of adequate sleep also discussed. Changes in health habits are decided on by the patient with goals and time frames  set for achieving them. Immunization and cancer screening needs are specifically addressed at this visit.   Elevated blood pressure reading 2nd occurrence of elevated reading. If this occurs at 32rd visit, pt will be labeled as hypertensive and will be managed with medication. He has a weight loss goal of 19 pounds over the next 5 months DASH diet and commitment to daily physical activity for a minimum of 30 minutes  discussed and encouraged, as a part of hypertension management. The importance of attaining a healthy weight is also discussed.  BP/Weight 10/30/2017 07/31/2017 04/06/2016 06/18/2013 02/12/2013 09/11/2012 05/15/2011  Systolic BP 142 142 - 120 132 161124 120  Diastolic BP 90 92 - 80 84 84 74  Wt. (Lbs) 257.08 262 255.8 249.04  247 254 247.06  BMI 34.87 36.54 35.68 35.22 34.93 35.92 34.94       Morbid obesity (HCC) Improved Patient re-educated about  the importance of commitment to a  minimum of 150 minutes of exercise per week.  The importance of healthy food choices with portion control discussed. Encouraged to start a food diary, count calories and to consider  joining a support group. Sample diet sheets offered. Goals set by the patient for the next several months.   Weight /BMI 10/30/2017 07/31/2017 04/06/2016  WEIGHT 257 lb 1.3 oz 262 lb 255 lb 12.8 oz  HEIGHT 6\' 0"  5\' 11"  5\' 11"   BMI 34.87 kg/m2 36.54 kg/m2 35.68 kg/m2      Dermatomycosis Affected area is under both breasts, clotrimazole/ betamethasone creams topically twice daly for 7 to 10 days, then, as needed  Allergic rhinitis Increased symptoms in the Summer months, flonase prescribed pt encouraged to use daily as he will be outside exercising more frequently  IGT (impaired glucose tolerance) Patient educated about the importance of limiting  Carbohydrate intake , the need to commit to daily physical activity for a minimum of 30 minutes , and to commit weight loss. The fact that changes in all these areas will reduce or eliminate all together the development of diabetes is stressed.   Diabetic Labs Latest Ref Rng & Units 07/31/2017  HbA1c <5.7 % of total Hgb 5.8(H)  Chol <200 mg/dL 096194  HDL >04>40 mg/dL 44  Calc LDL mg/dL (calc) 540(J127(H)  Triglycerides <150 mg/dL 811118  Creatinine 9.140.60 - 1.35 mg/dL 7.821.09   BP/Weight 9/56/21306/26/2019 07/31/2017 04/06/2016 06/18/2013 02/12/2013 09/11/2012 05/15/2011  Systolic BP 142 142 - 120 132 865124 120  Diastolic BP 90 92 - 80 84 84 74  Wt. (Lbs) 257.08 262 255.8 249.04 247 254 247.06  BMI 34.87 36.54 35.68 35.22 34.93 35.92 34.94   No flowsheet data found.    Vitamin D deficiency Advised to take OTC vit D3 2000 IU daily

## 2017-11-11 ENCOUNTER — Telehealth: Payer: Self-pay

## 2017-11-11 DIAGNOSIS — R7302 Impaired glucose tolerance (oral): Secondary | ICD-10-CM

## 2017-11-11 DIAGNOSIS — E785 Hyperlipidemia, unspecified: Secondary | ICD-10-CM

## 2017-11-11 DIAGNOSIS — E669 Obesity, unspecified: Secondary | ICD-10-CM

## 2017-11-11 NOTE — Telephone Encounter (Signed)
Labs ordered per Dr.Simpson. Lab req mailed to patient.

## 2018-04-14 ENCOUNTER — Ambulatory Visit: Payer: 59 | Admitting: Family Medicine

## 2018-04-17 ENCOUNTER — Ambulatory Visit: Payer: 59 | Admitting: Family Medicine

## 2018-05-28 ENCOUNTER — Ambulatory Visit: Payer: 59 | Admitting: Family Medicine

## 2018-06-26 ENCOUNTER — Encounter: Payer: Self-pay | Admitting: Family Medicine

## 2018-06-26 ENCOUNTER — Ambulatory Visit: Payer: 59 | Admitting: Family Medicine

## 2018-06-26 ENCOUNTER — Ambulatory Visit (INDEPENDENT_AMBULATORY_CARE_PROVIDER_SITE_OTHER): Payer: 59 | Admitting: Family Medicine

## 2018-06-26 VITALS — BP 140/92 | HR 87 | Resp 14 | Ht 72.0 in | Wt 263.1 lb

## 2018-06-26 DIAGNOSIS — E785 Hyperlipidemia, unspecified: Secondary | ICD-10-CM

## 2018-06-26 DIAGNOSIS — E8881 Metabolic syndrome: Secondary | ICD-10-CM | POA: Diagnosis not present

## 2018-06-26 DIAGNOSIS — I10 Essential (primary) hypertension: Secondary | ICD-10-CM | POA: Diagnosis not present

## 2018-06-26 DIAGNOSIS — R7302 Impaired glucose tolerance (oral): Secondary | ICD-10-CM | POA: Diagnosis not present

## 2018-06-26 DIAGNOSIS — Z125 Encounter for screening for malignant neoplasm of prostate: Secondary | ICD-10-CM

## 2018-06-26 NOTE — Patient Instructions (Addendum)
F/U in 4 months, call if you need me before    Fasting lipid, cmp and EGFR, HBA1C, TSH and CBC, PSA 1 week before next visit   It is important that you exercise regularly at least 60 minutes 5 times a week. If you develop chest pain, have severe difficulty breathing, or feel very tired, stop exercising immediately and seek medical attention    Think about what you will eat, plan ahead.Thank you  for choosing [facility]. We consider it a privelige to serve you.  Delivering excellent health care in a caring and  compassionate way is our goal.  Partnering with you,  so that together we can achieve this goal is our strategy. Weight loss goal of 10 pounds    Choose " clean, green, fresh or frozen" over canned, processed or packaged foods which are more sugary, salty and fatty. 70 to 75% of food eaten should be vegetables and fruit. Three meals at set times with snacks allowed between meals, but they must be fruit or vegetables. Aim to eat over a 12 hour period , example 7 am to 7 pm, and STOP after  your last meal of the day. Drink water,generally about 64 ounces per day, no other drink is as healthy. Fruit juice is best enjoyed in a healthy way, by EATING the fruit.   Thanks for choosing [facility], we consider it a privelige to serve you.  /

## 2018-06-29 ENCOUNTER — Encounter: Payer: Self-pay | Admitting: Family Medicine

## 2018-06-29 DIAGNOSIS — E8881 Metabolic syndrome: Secondary | ICD-10-CM | POA: Insufficient documentation

## 2018-06-29 HISTORY — DX: Metabolic syndrome: E88.810

## 2018-06-29 HISTORY — DX: Metabolic syndrome: E88.81

## 2018-06-29 NOTE — Assessment & Plan Note (Signed)
The increased risk of cardiovascular disease associated with this diagnosis, and the need to consistently work on lifestyle to change this is discussed. Following  a  heart healthy diet ,commitment to 30 minutes of exercise at least 5 days per week, as well as control of blood sugar and cholesterol , and achieving a healthy weight are all the areas to be addressed .  

## 2018-06-29 NOTE — Assessment & Plan Note (Signed)
requests "no medication" unless still elevated at next visit. Highly motivated to avoid meds by weight loss and lifestyle change F/u in 3 months, will get EKG at next visit if BP remains elevated and he will need medication DASH diet and commitment to daily physical activity for a minimum of 30 minutes discussed and encouraged, as a part of hypertension management. The importance of attaining a healthy weight is also discussed.  BP/Weight 06/26/2018 10/30/2017 07/31/2017 04/06/2016 06/18/2013 02/12/2013 09/11/2012  Systolic BP 140 142 142 - 120 842 124  Diastolic BP 92 90 92 - 80 84 84  Wt. (Lbs) 263.12 257.08 262 255.8 249.04 247 254  BMI 35.69 34.87 36.54 35.68 35.22 34.93 35.92

## 2018-06-29 NOTE — Assessment & Plan Note (Signed)
Deteriorated.Obesity linked with hypertension, IGT and hyperlipidemia  Patient re-educated about  the importance of commitment to a  minimum of 150 minutes of exercise per week as able.  The importance of healthy food choices with portion control discussed, as well as eating regularly and within a 12 hour window most days. The need to choose "clean , green" food 50 to 75% of the time is discussed, as well as to make water the primary drink and set a goal of 64 ounces water daily.  Encouraged to start a food diary,  and to consider  joining a support group. Sample diet sheets offered. Goals set by the patient for the next several months.   Weight /BMI 06/26/2018 10/30/2017 07/31/2017  WEIGHT 263 lb 1.9 oz 257 lb 1.3 oz 262 lb  HEIGHT 6\' 0"  6\' 0"  5\' 11"   BMI 35.69 kg/m2 34.87 kg/m2 36.54 kg/m2

## 2018-06-29 NOTE — Progress Notes (Signed)
Darryl Christensen     MRN: [id number]      DOB: July 22, 1975   HPI Mr. Kravec is here for follow up and re-evaluation of chronic medical conditions, medication management and review of any available recent lab and radiology data.  Preventive health is updated, specifically  Cancer screening and Immunization.   . The PT denies any adverse reactions to current medications since the last visit.  Still no regular exercise commitment, has done some 5K runs and plans to make this a regular family event as he thoroughly enjoyed this. Intentionally working on more down time for himself, and thinking of moving closer to his family who is very close to Has joined [employer] and is using different Apps to help with changing food choice, he has cleaned out his pantry, so is consciously working on improving his health, although the benefits which are measurable are still not evident. I have encouraged hi to start weighting daily, but at least once weekly  Loves dancing and I have recommended looking at dance cruises, and this certainly has great appeal  ROS Denies recent fever or chills. Denies sinus pressure, nasal congestion, ear pain or sore throat. Denies chest congestion, productive cough or wheezing. Denies chest pains, palpitations and leg swelling Denies abdominal pain, nausea, vomiting,diarrhea or constipation.   Denies dysuria, frequency, hesitancy or incontinence. Denies joint pain, swelling and limitation in mobility. Denies headaches, seizures, numbness, or tingling. Denies depression, anxiety or insomnia. Denies skin break down or rash.   PE  BP (!) 140/92   Pulse 87   Resp 14   Ht 6' (1.829 m)   Wt 263 lb 1.9 oz (119.4 kg)   SpO2 98% Comment: room air  BMI 35.69 kg/m   Patient alert and oriented and in no cardiopulmonary distress.  HEENT: No facial asymmetry, EOMI,   oropharynx pink and moist.  Neck supple no JVD, no mass.  Chest: Clear to auscultation  bilaterally.  CVS: S1, S2 no murmurs, no S3.Regular rate.  ABD: Soft non tender.   Ext: No edema  MS: Adequate ROM spine, shoulders, hips and knees.  Skin: Intact, no ulcerations or rash noted.  Psych: Good eye contact, normal affect. Memory intact not anxious or depressed appearing.  CNS: CN 2-12 intact, power,  normal throughout.no focal deficits noted.   Assessment & Plan  Essential hypertension requests "no medication" unless still elevated at next visit. Highly motivated to avoid meds by weight loss and lifestyle change F/u in 3 months, will get EKG at next visit if BP remains elevated and he will need medication DASH diet and commitment to daily physical activity for a minimum of 30 minutes discussed and encouraged, as a part of hypertension management. The importance of attaining a healthy weight is also discussed.  BP/Weight 06/26/2018 10/30/2017 07/31/2017 04/06/2016 06/18/2013 02/12/2013 09/11/2012  Systolic BP 140 142 142 - 120 449 124  Diastolic BP 92 90 92 - 80 84 84  Wt. (Lbs) 263.12 257.08 262 255.8 249.04 247 254  BMI 35.69 34.87 36.54 35.68 35.22 34.93 35.92       Dyslipidemia, goal LDL below 100 Hyperlipidemia:Low fat diet discussed and encouraged.   Lipid Panel  Lab Results  Component Value Date   CHOL 194 07/31/2017   HDL 44 07/31/2017   LDLCALC 127 (H) 07/31/2017   TRIG 118 07/31/2017   CHOLHDL 4.4 07/31/2017   Updated lab needed at/ before next visit.     Morbid obesity (HCC) Deteriorated.Obesity linked with hypertension,  IGT and hyperlipidemia  Patient re-educated about  the importance of commitment to a  minimum of 150 minutes of exercise per week as able.  The importance of healthy food choices with portion control discussed, as well as eating regularly and within a 12 hour window most days. The need to choose "clean , green" food 50 to 75% of the time is discussed, as well as to make water the primary drink and set a goal of 64 ounces  water daily.  Encouraged to start a food diary,  and to consider  joining a support group. Sample diet sheets offered. Goals set by the patient for the next several months.   Weight /BMI 06/26/2018 10/30/2017 07/31/2017  WEIGHT 263 lb 1.9 oz 257 lb 1.3 oz 262 lb  HEIGHT 6\' 0"  6\' 0"  5\' 11"   BMI 35.69 kg/m2 34.87 kg/m2 36.54 kg/m2      Metabolic syndrome X The increased risk of cardiovascular disease associated with this diagnosis, and the need to consistently work on lifestyle to change this is discussed. Following  a  heart healthy diet ,commitment to 30 minutes of exercise at least 5 days per week, as well as control of blood sugar and cholesterol , and achieving a healthy weight are all the areas to be addressed .

## 2018-06-29 NOTE — Assessment & Plan Note (Signed)
Hyperlipidemia:Low fat diet discussed and encouraged.   Lipid Panel  Lab Results  Component Value Date   CHOL 194 07/31/2017   HDL 44 07/31/2017   LDLCALC 127 (H) 07/31/2017   TRIG 118 07/31/2017   CHOLHDL 4.4 07/31/2017   Updated lab needed at/ before next visit.

## 2018-08-22 ENCOUNTER — Encounter: Payer: Self-pay | Admitting: Family Medicine

## 2018-09-01 ENCOUNTER — Telehealth: Payer: Self-pay | Admitting: Family Medicine

## 2018-09-01 NOTE — Telephone Encounter (Signed)
Call made to patient a sf/u from his note to me which he has not read. States he is doing well with weight loss, unsure if he will have labs now or just before May visit

## 2018-10-28 ENCOUNTER — Encounter: Payer: 59 | Admitting: Family Medicine

## 2018-11-20 ENCOUNTER — Encounter: Payer: 59 | Admitting: Family Medicine

## 2018-12-24 ENCOUNTER — Encounter: Payer: 59 | Admitting: Family Medicine

## 2019-01-26 ENCOUNTER — Telehealth: Payer: Self-pay | Admitting: *Deleted

## 2019-01-26 DIAGNOSIS — Z125 Encounter for screening for malignant neoplasm of prostate: Secondary | ICD-10-CM | POA: Diagnosis not present

## 2019-01-26 DIAGNOSIS — R7302 Impaired glucose tolerance (oral): Secondary | ICD-10-CM | POA: Diagnosis not present

## 2019-01-26 DIAGNOSIS — I10 Essential (primary) hypertension: Secondary | ICD-10-CM | POA: Diagnosis not present

## 2019-01-26 DIAGNOSIS — E785 Hyperlipidemia, unspecified: Secondary | ICD-10-CM

## 2019-01-26 NOTE — Telephone Encounter (Signed)
Pt called going to get labs done needs updated lab orders is going to [facility] this morning

## 2019-01-26 NOTE — Telephone Encounter (Signed)
Expired labs reordered  

## 2019-01-27 ENCOUNTER — Encounter: Payer: Self-pay | Admitting: Family Medicine

## 2019-01-27 LAB — CBC
HCT: 41.2 % (ref 38.5–50.0)
Hemoglobin: 14.1 g/dL (ref 13.2–17.1)
MCH: 30.1 pg (ref 27.0–33.0)
MCHC: 34.2 g/dL (ref 32.0–36.0)
MCV: 87.8 fL (ref 80.0–100.0)
MPV: 11.7 fL (ref 7.5–12.5)
Platelets: 218 10*3/uL (ref 140–400)
RBC: 4.69 10*6/uL (ref 4.20–5.80)
RDW: 13 % (ref 11.0–15.0)
WBC: 5 10*3/uL (ref 3.8–10.8)

## 2019-01-27 LAB — PSA: PSA: 0.9 ng/mL (ref <=4.0)

## 2019-01-27 LAB — LIPID PANEL
Cholesterol: 166 mg/dL (ref <200)
HDL: 41 mg/dL (ref >=40)
LDL Cholesterol (Calc): 109 mg/dL (calc) — ABNORMAL HIGH
Non-HDL Cholesterol (Calc): 125 mg/dL (calc) (ref <130)
Total CHOL/HDL Ratio: 4 (calc) (ref <5.0)
Triglycerides: 70 mg/dL (ref <150)

## 2019-01-27 LAB — COMPLETE METABOLIC PANEL WITH GFR
AG Ratio: 1.4 (calc) (ref 1.0–2.5)
ALT: 35 U/L (ref 9–46)
AST: 22 U/L (ref 10–40)
Albumin: 4.6 g/dL (ref 3.6–5.1)
Alkaline phosphatase (APISO): 61 U/L (ref 36–130)
BUN: 9 mg/dL (ref 7–25)
CO2: 27 mmol/L (ref 20–32)
Calcium: 9.6 mg/dL (ref 8.6–10.3)
Chloride: 103 mmol/L (ref 98–110)
Creat: 1.11 mg/dL (ref 0.60–1.35)
GFR, Est African American: 95 mL/min/{1.73_m2} (ref >=60)
GFR, Est Non African American: 82 mL/min/{1.73_m2} (ref >=60)
Globulin: 3.3 g/dL (calc) (ref 1.9–3.7)
Glucose, Bld: 88 mg/dL (ref 65–99)
Potassium: 3.8 mmol/L (ref 3.5–5.3)
Sodium: 139 mmol/L (ref 135–146)
Total Bilirubin: 0.6 mg/dL (ref 0.2–1.2)
Total Protein: 7.9 g/dL (ref 6.1–8.1)

## 2019-01-27 LAB — HEMOGLOBIN A1C
Hgb A1c MFr Bld: 5.6 % of total Hgb (ref <5.7)
Mean Plasma Glucose: 114 (calc)
eAG (mmol/L): 6.3 (calc)

## 2019-01-27 LAB — TSH: TSH: 1.28 mIU/L (ref 0.40–4.50)

## 2019-01-28 ENCOUNTER — Ambulatory Visit (INDEPENDENT_AMBULATORY_CARE_PROVIDER_SITE_OTHER): Payer: 59 | Admitting: Family Medicine

## 2019-01-28 ENCOUNTER — Other Ambulatory Visit: Payer: Self-pay

## 2019-01-28 ENCOUNTER — Encounter: Payer: Self-pay | Admitting: Family Medicine

## 2019-01-28 VITALS — BP 124/80 | HR 95 | Temp 98.3°F | Resp 15 | Ht 72.0 in | Wt 234.1 lb

## 2019-01-28 DIAGNOSIS — Z0000 Encounter for general adult medical examination without abnormal findings: Secondary | ICD-10-CM

## 2019-01-28 DIAGNOSIS — E785 Hyperlipidemia, unspecified: Secondary | ICD-10-CM

## 2019-01-28 DIAGNOSIS — E559 Vitamin D deficiency, unspecified: Secondary | ICD-10-CM | POA: Diagnosis not present

## 2019-01-28 NOTE — Patient Instructions (Addendum)
F/U in 6 months, call if you need me before  CONGRATULATIONS, keep it up  Weight loss goal of 12 to 15 pounds in next 6 months  Normal blood pressure has normalized  Labs are excellent  Thanks for choosing [facility], we consider it a privelige to serve you

## 2019-01-31 ENCOUNTER — Encounter: Payer: Self-pay | Admitting: Family Medicine

## 2019-01-31 NOTE — Assessment & Plan Note (Signed)
Annual exam as documented. Counseling done  re healthy lifestyle involving commitment to 150 minutes exercise per week, heart healthy diet, and attaining healthy weight.The importance of adequate sleep also discussed. Regular seat belt use and home safety, is also discussed. Changes in health habits are decided on by the patient with goals and time frames  set for achieving them. Immunization and cancer screening needs are specifically addressed at this visit. Pt congratulated on great success with lifestyle change re exercise, dietary change and weight loss, wuith normalization of blood pressure, as wellas cholesterol and blood sugar

## 2019-01-31 NOTE — Assessment & Plan Note (Signed)
Updated lab needed at/ before next visit. Recommend oTC vit D 3  1000 IU daily

## 2019-01-31 NOTE — Progress Notes (Signed)
   Darryl Christensen     MRN: [id number]      DOB: [birth year]   HPI: Patient is in for annual physical exam. No other health concerns are expressed or addressed at the visit. Recent labs, are reviewed. Immunization is reviewed , and  Is up to date  PE; BP 124/80   Pulse 95   Temp 98.3 F (36.8 C) (Temporal)   Resp 15   Ht 6' (1.829 m)   Wt 234 lb 1.9 oz (106.2 kg)   SpO2 97%   BMI 31.75 kg/m   Pleasant male, alert and oriented x 3, in no cardio-pulmonary distress. Afebrile. HEENT No facial trauma or asymetry. Sinuses non tender. EOMI External ears normal,  Neck: supple, no adenopathy,JVD or thyromegaly.No bruits.  Chest: Clear to ascultation bilaterally.No crackles or wheezes. Non tender to palpation    Cardiovascular system; Heart sounds normal,  S1 and  S2 ,no S3.  No murmur, or thrill. Apical beat not displaced Peripheral pulses normal.  Abdomen: Soft, non tender,No guarding, tenderness or rebound.     Musculoskeletal exam: Full ROM of spine, hips , shoulders and knees. No deformity ,swelling or crepitus noted. No muscle wasting or atrophy.   Neurologic: Cranial nerves 2 to 12 intact. Power, tone ,sensation  normal throughout. No disturbance in gait. No tremor.  Skin: Intact, no ulceration, erythema , scaling or rash noted. Pigmentation normal throughout  Psych; Normal mood and affect. Judgement and concentration normal   Assessment & Plan:  Annual physical exam Annual exam as documented. Counseling done  re healthy lifestyle involving commitment to 150 minutes exercise per week, heart healthy diet, and attaining healthy weight.The importance of adequate sleep also discussed. Regular seat belt use and home safety, is also discussed. Changes in health habits are decided on by the patient with goals and time frames  set for achieving them. Immunization and cancer screening needs are specifically addressed at this visit. Pt congratulated on great  success with lifestyle change re exercise, dietary change and weight loss, wuith normalization of blood pressure, as wellas cholesterol and blood sugar  Dyslipidemia, goal LDL below 100 Hyperlipidemia:Low fat diet discussed and encouraged.   Lipid Panel  Lab Results  Component Value Date   CHOL 166 01/26/2019   HDL 41 01/26/2019   LDLCALC 109 (H) 01/26/2019   TRIG 70 01/26/2019   CHOLHDL 4.0 01/26/2019  needs to lower fat intake and continue regular exercise , improved, bur still not at goal     Vitamin D deficiency Updated lab needed at/ before next visit. Recommend oTC vit D 3  1000 IU daily

## 2019-01-31 NOTE — Assessment & Plan Note (Signed)
Hyperlipidemia:Low fat diet discussed and encouraged.   Lipid Panel  Lab Results  Component Value Date   CHOL 166 01/26/2019   HDL 41 01/26/2019   LDLCALC 109 (H) 01/26/2019   TRIG 70 01/26/2019   CHOLHDL 4.0 01/26/2019  needs to lower fat intake and continue regular exercise , improved, bur still not at goal

## 2019-02-05 ENCOUNTER — Telehealth: Payer: Self-pay

## 2019-02-05 DIAGNOSIS — R7302 Impaired glucose tolerance (oral): Secondary | ICD-10-CM

## 2019-02-05 DIAGNOSIS — E559 Vitamin D deficiency, unspecified: Secondary | ICD-10-CM

## 2019-02-05 DIAGNOSIS — E785 Hyperlipidemia, unspecified: Secondary | ICD-10-CM

## 2019-02-05 NOTE — Telephone Encounter (Signed)
Labs ordered per MD 

## 2019-05-19 DIAGNOSIS — H5213 Myopia, bilateral: Secondary | ICD-10-CM | POA: Diagnosis not present

## 2019-06-16 ENCOUNTER — Ambulatory Visit: Payer: 59 | Admitting: Family Medicine

## 2019-07-27 ENCOUNTER — Ambulatory Visit: Payer: 59 | Admitting: Family Medicine

## 2019-08-17 ENCOUNTER — Other Ambulatory Visit: Payer: Self-pay | Admitting: Family Medicine

## 2019-08-17 ENCOUNTER — Encounter: Payer: Self-pay | Admitting: Family Medicine

## 2019-08-17 MED ORDER — MOMETASONE FUROATE 50 MCG/ACT NA SUSP: 2.0000 | Freq: Every day | NASAL | 3 refills | Status: AC

## 2019-08-17 MED ORDER — MONTELUKAST SODIUM 10 MG PO TABS: 10.0000 mg | ORAL_TABLET | Freq: Every day | ORAL | 3 refills | Status: AC

## 2019-08-17 MED FILL — MONTELUKAST SOD 10 MG TAB: 10 | 30 days supply | Qty: 30 | Fill #0

## 2020-04-28 ENCOUNTER — Ambulatory Visit: Payer: 59 | Attending: Internal Medicine

## 2020-04-28 DIAGNOSIS — Z23 Encounter for immunization: Secondary | ICD-10-CM

## 2020-04-28 NOTE — Progress Notes (Signed)
   Covid-19 Vaccination Clinic  Name:  DRESDEN LOZITO    MRN: [id number] DOB: February 17, 1976  04/28/2020  Mr. Don was observed post Covid-19 immunization for 15 minutes without incident. He was provided with Vaccine Information Sheet and instruction to access the V-Safe system.   Mr. Macaraeg was instructed to call 911 with any severe reactions post vaccine: Marland Kitchen Difficulty breathing  . Swelling of face and throat  . A fast heartbeat  . A bad rash all over body  . Dizziness and weakness   Immunizations Administered    Name Date Dose VIS Date Route   Pfizer COVID-19 Vaccine 04/28/2020  3:56 PM 0.3 mL 02/24/2020 Intramuscular   Manufacturer: [employer], [employer]   Lot: [id number]   NDC: [address]-2

## 2020-07-21 ENCOUNTER — Ambulatory Visit (INDEPENDENT_AMBULATORY_CARE_PROVIDER_SITE_OTHER): Payer: 59 | Admitting: Podiatry

## 2020-07-21 ENCOUNTER — Ambulatory Visit (INDEPENDENT_AMBULATORY_CARE_PROVIDER_SITE_OTHER): Payer: 59

## 2020-07-21 ENCOUNTER — Other Ambulatory Visit: Payer: Self-pay

## 2020-07-21 DIAGNOSIS — S99921A Unspecified injury of right foot, initial encounter: Secondary | ICD-10-CM

## 2020-07-21 DIAGNOSIS — S90112A Contusion of left great toe without damage to nail, initial encounter: Secondary | ICD-10-CM | POA: Diagnosis not present

## 2020-07-21 NOTE — Progress Notes (Signed)
Subjective:   Patient ID: Darryl Christensen, male   DOB: 45 y.o.   MRN: [id number]   HPI 45 year old male presents the office today with concerns of discomfort to his right big toe.  He did hit it last week but not a significant amount.  He states the pain comes and goes.  He has pain most of the big toe.  No redness or warmth.  He said no recent treatment.  He is interested in orthotics.   Review of Systems  All other systems reviewed and are negative.  Past Medical History:  Diagnosis Date  . Allergy   . Essential hypertension 08/04/2017   Formally dx 06/2018  . IGT (impaired glucose tolerance) 11/02/2017   HBA1C is 5.8 in 10/2017  . Metabolic syndrome X 06/29/2018  . Morbid obesity (HCC) 03/09/2008   Qualifier: Diagnosis of  By: Lodema Hong MD, Margaret      No past surgical history on file.   Current Outpatient Medications:  .  clotrimazole-betamethasone (LOTRISONE) cream, Apply 1 application topically 2 (two) times daily. Apply to rash on anterior chest twice daily for 10 days , then as needed, Disp: 45 g, Rfl: 1 .  fluticasone (FLONASE) 50 MCG/ACT nasal spray, Two sprays per nostril once daily, Disp: 48 g, Rfl: 1 .  mometasone (NASONEX) 50 MCG/ACT nasal spray, Place 2 sprays into the nose daily., Disp: 17 g, Rfl: 3 .  montelukast (SINGULAIR) 10 MG tablet, Take 1 tablet (10 mg total) by mouth at bedtime., Disp: 30 tablet, Rfl: 3  No Known Allergies       Objective:  Physical Exam  General: AAO x3, NAD-presents today wearing flat shoes  Dermatological: Skin is warm, dry and supple bilateral. There are no open sores, no preulcerative lesions, no rash or signs of infection present.  Vascular: Dorsalis Pedis artery and Posterior Tibial artery pedal pulses are 2/4 bilateral with immedate capillary fill time. There is no pain with calf compression, swelling, warmth, erythema.   Neruologic: Grossly intact via light touch bilateral.   Musculoskeletal: There is no significant  tenderness with MPJ.  Majority of tenderness is along the hallux IPJ and there is trace edema.  Flexor, extensor tendons appear to be intact.  The toe is in rectus position.  Muscular strength 5/5 in all groups tested bilateral.  Gait: Unassisted, Nonantalgic.      Assessment:   45 year old male left hallux contusion    Plan:  -Treatment options discussed including all alternatives, risks, and complications -Etiology of symptoms were discussed -X-rays obtained reviewed.  No evidence of acute fracture identified today. -Discussed wearing stiffer soled shoe in shoes with arch support.  Dispensed a pair of power step inserts. We were out of the graphite inserts which may be helpful as well.  -He has ibuprofen 800mg  and he can use that as needed -Ice pack dispensed.   DPM

## 2021-02-23 ENCOUNTER — Ambulatory Visit: Payer: 59 | Attending: Internal Medicine

## 2021-02-23 ENCOUNTER — Other Ambulatory Visit (HOSPITAL_BASED_OUTPATIENT_CLINIC_OR_DEPARTMENT_OTHER): Payer: Self-pay

## 2021-02-23 DIAGNOSIS — Z23 Encounter for immunization: Secondary | ICD-10-CM

## 2021-02-23 MED ORDER — PFIZER COVID-19 VAC BIVALENT 30 MCG/0.3ML IM SUSP
INTRAMUSCULAR | 0 refills | Status: DC
  Filled 2021-02-23: qty 0.3, 1d supply, fill #0

## 2021-02-23 MED ORDER — INFLUENZA VAC SPLIT QUAD 0.5 ML IM SUSY
PREFILLED_SYRINGE | INTRAMUSCULAR | 0 refills | Status: DC
  Filled 2021-02-23: qty 0.5, 1d supply, fill #0

## 2021-02-23 NOTE — Progress Notes (Signed)
   Covid-19 Vaccination Clinic  Name:  Darryl Christensen    MRN: [id number] DOB: 09/03/1975  02/23/2021  Mr. Darryl Christensen was observed post Covid-19 immunization for 15 minutes without incident. He was provided with Vaccine Information Sheet and instruction to access the V-Safe system.   Mr. Darryl Christensen was instructed to call 911 with any severe reactions post vaccine: Difficulty breathing  Swelling of face and throat  A fast heartbeat  A bad rash all over body  Dizziness and weakness   Immunizations Administered     Name Date Dose VIS Date Route   Pfizer Covid-19 Vaccine Bivalent Booster 02/23/2021  1:57 PM 0.3 mL 01/04/2021 Intramuscular   Manufacturer: [employer], [employer]   Lot: [id number]   NDC: [phone]

## 2022-01-05 ENCOUNTER — Encounter: Payer: Self-pay | Admitting: Internal Medicine

## 2022-01-05 ENCOUNTER — Ambulatory Visit (INDEPENDENT_AMBULATORY_CARE_PROVIDER_SITE_OTHER): Payer: 59 | Admitting: Internal Medicine

## 2022-01-05 DIAGNOSIS — E785 Hyperlipidemia, unspecified: Secondary | ICD-10-CM

## 2022-01-05 DIAGNOSIS — Z0000 Encounter for general adult medical examination without abnormal findings: Secondary | ICD-10-CM

## 2022-01-05 DIAGNOSIS — E559 Vitamin D deficiency, unspecified: Secondary | ICD-10-CM | POA: Diagnosis not present

## 2022-01-05 NOTE — Progress Notes (Signed)
Complete physical exam  Patient: Darryl Christensen   DOB: 1975-08-31   46 y.o. Male  MRN: [id number]  Subjective:    Chief Complaint  Patient presents with   Annual Exam   Darryl Christensen Lunch Modi is a 46 y.o. male who presents today for a complete physical exam. He reports consuming a general diet.  He is not exercising as much as he previously did, but plans to become more active this fall.  He generally feels well. He reports sleeping well. He does not have additional problems to discuss today.   Most recent fall risk assessment:    01/05/2022    3:59 PM  Fall Risk   Falls in the past year? 0  Number falls in past yr: 0  Injury with Fall? 0  Risk for fall due to : No Fall Risks  Follow up Falls evaluation completed   Most recent depression screenings:    01/05/2022    3:59 PM 01/28/2019   11:15 AM  PHQ 2/9 Scores  PHQ - 2 Score 0 0   Past Medical History:  Diagnosis Date   Allergy    Essential hypertension 08/04/2017   Formally dx 06/2018   IGT (impaired glucose tolerance) 11/02/2017   HBA1C is 5.8 in 10/2017   Metabolic syndrome X 06/29/2018   Morbid obesity (HCC) 03/09/2008   Qualifier: Diagnosis of  By: Lodema Hong MD, Margaret     History reviewed. No pertinent surgical history. Social History   Tobacco Use   Smoking status: Never   Smokeless tobacco: Never   Family History  Problem Relation Age of Onset   Diabetes Neg Hx    Heart attack Neg Hx    Hypertension Neg Hx    No Known Allergies    Patient Care Team: Kerri Perches, MD as PCP - General (Family Medicine)   Outpatient Medications Prior to Visit  Medication Sig   clotrimazole-betamethasone (LOTRISONE) cream Apply 1 application topically 2 (two) times daily. Apply to rash on anterior chest twice daily for 10 days , then as needed   fluticasone (FLONASE) 50 MCG/ACT nasal spray Two sprays per nostril once daily   mometasone (NASONEX) 50 MCG/ACT nasal spray Place 2 sprays into the nose daily.    montelukast (SINGULAIR) 10 MG tablet Take 1 tablet (10 mg total) by mouth at bedtime.   [DISCONTINUED] COVID-19 mRNA bivalent vaccine, Pfizer, (PFIZER COVID-19 VAC BIVALENT) injection Inject into the muscle.   [DISCONTINUED] influenza vac split quadrivalent PF (FLUARIX) 0.5 ML injection Inject into the muscle.   No facility-administered medications prior to visit.    Review of Systems  Constitutional:  Negative for chills and fever.  HENT:  Negative for sore throat.   Respiratory:  Negative for cough and shortness of breath.   Cardiovascular:  Negative for chest pain, palpitations and leg swelling.  Gastrointestinal:  Negative for abdominal pain, blood in stool, constipation, diarrhea, nausea and vomiting.  Genitourinary:  Negative for dysuria and hematuria.  Musculoskeletal:  Negative for myalgias.  Skin:  Negative for itching and rash.  Neurological:  Negative for dizziness and headaches.  Psychiatric/Behavioral:  Negative for depression and suicidal ideas.       Objective:     BP 136/88 (BP Location: Right Arm, Cuff Size: Normal)   Pulse 96   Ht 6' (1.829 m)   Wt 263 lb 3.2 oz (119.4 kg)   SpO2 98%   BMI 35.70 kg/m  BP Readings from Last 3 Encounters:  01/05/22 136/88  01/28/19 124/80  06/26/18 (!) 140/92   Physical Exam Vitals reviewed.  Constitutional:      General: He is not in acute distress.    Appearance: Normal appearance. He is not ill-appearing.  HENT:     Head: Normocephalic and atraumatic.     Nose: Nose normal. No congestion or rhinorrhea.     Mouth/Throat:     Mouth: Mucous membranes are moist.     Pharynx: Oropharynx is clear.  Eyes:     Extraocular Movements: Extraocular movements intact.     Conjunctiva/sclera: Conjunctivae normal.     Pupils: Pupils are equal, round, and reactive to light.  Cardiovascular:     Rate and Rhythm: Normal rate and regular rhythm.     Pulses: Normal pulses.     Heart sounds: Normal heart sounds. No murmur  heard. Pulmonary:     Effort: Pulmonary effort is normal.     Breath sounds: Normal breath sounds. No wheezing, rhonchi or rales.  Abdominal:     General: Abdomen is flat. Bowel sounds are normal. There is no distension.     Palpations: Abdomen is soft.     Tenderness: There is no abdominal tenderness.  Musculoskeletal:        General: No swelling or deformity. Normal range of motion.     Cervical back: Normal range of motion.  Skin:    General: Skin is warm and dry.     Capillary Refill: Capillary refill takes less than 2 seconds.  Neurological:     General: No focal deficit present.     Mental Status: He is alert and oriented to person, place, and time.     Motor: No weakness.  Psychiatric:        Mood and Affect: Mood normal.        Behavior: Behavior normal.        Thought Content: Thought content normal.     No results found for any visits on 01/05/22. Last CBC Lab Results  Component Value Date   WBC 5.0 01/26/2019   HGB 14.1 01/26/2019   HCT 41.2 01/26/2019   MCV 87.8 01/26/2019   MCH 30.1 01/26/2019   RDW 13.0 01/26/2019   PLT 218 01/26/2019   Last metabolic panel Lab Results  Component Value Date   GLUCOSE 88 01/26/2019   NA 139 01/26/2019   K 3.8 01/26/2019   CL 103 01/26/2019   CO2 27 01/26/2019   BUN 9 01/26/2019   CREATININE 1.11 01/26/2019   GFRNONAA 82 01/26/2019   CALCIUM 9.6 01/26/2019   PROT 7.9 01/26/2019   BILITOT 0.6 01/26/2019   AST 22 01/26/2019   ALT 35 01/26/2019   Last lipids Lab Results  Component Value Date   CHOL 166 01/26/2019   HDL 41 01/26/2019   LDLCALC 109 (H) 01/26/2019   TRIG 70 01/26/2019   CHOLHDL 4.0 01/26/2019   Last hemoglobin A1c Lab Results  Component Value Date   HGBA1C 5.6 01/26/2019   Last thyroid functions Lab Results  Component Value Date   TSH 1.28 01/26/2019   Last vitamin D Lab Results  Component Value Date   VD25OH 27 (L) 07/31/2017       Assessment & Plan:    Routine Health Maintenance  and Physical Exam  Immunization History  Administered Date(s) Administered   Influenza,inj,Quad PF,6+ Mos 02/28/2020   PFIZER(Purple Top)SARS-COV-2 Vaccination 04/28/2020   Pfizer Covid-19 Vaccine Bivalent Booster 6yrs & up 02/23/2021   Tdap 08/01/2017    Health Maintenance  Topic Date Due   Hepatitis C Screening  Never done   INFLUENZA VACCINE  01/29/2022 (Originally 12/05/2021)   TETANUS/TDAP  08/02/2027   COVID-19 Vaccine  Completed   HIV Screening  Completed   HPV VACCINES  Aged Out    Discussed health benefits of physical activity, and encouraged him to engage in regular exercise appropriate for his age and condition.  Problem List Items Addressed This Visit       Other   Dyslipidemia, goal LDL below 100    Update lipid panel at future lab appointment. Provided with information regarding the Mediterranean diet.      Annual physical exam    Seen today for annual physical exam. Lab to be obtained at a future date Outstanding preventative healthcare maintenance items were reviewed.  He will receive his influenza vaccine at work later this month.  One-time HCV screening will be included in his lab work.  Additional preventative health maintenance items are up-to-date. We reviewed healthy lifestyle recommendations and I discussed the Mediterranean diet with him.  We also discussed the national recommendation for 150 total minutes of exercise weekly as being a minimum.  He states that he has a plan to become more active this fall and lose weight as he is aware of his recent weight gain.      Vitamin D deficiency    Vit D insufficiency noted on labs from 2020. Not currently on supplementation. Repeat vitamin D level at future lab appointment.      Return in about 6 months (around 07/06/2022).     Billie Lade, MD

## 2022-01-05 NOTE — Assessment & Plan Note (Signed)
Update lipid panel at future lab appointment. Provided with information regarding the Mediterranean diet.

## 2022-01-05 NOTE — Patient Instructions (Signed)
It was a pleasure to see you today.  Thank you for giving [place] the opportunity to be involved in your care.  Below is a brief recap of your visit and next steps.  We will plan for follow up with Dr. Lodema Hong in the next 6 months  Summary We completed you annual physical today.  Next steps Need lab appointment and follow up with Dr. Lodema Hong

## 2022-01-05 NOTE — Assessment & Plan Note (Addendum)
Seen today for annual physical exam. Lab to be obtained at a future date Outstanding preventative healthcare maintenance items were reviewed.  He will receive his influenza vaccine at work later this month.  One-time HCV screening will be included in his lab work.  Additional preventative health maintenance items are up-to-date. We reviewed healthy lifestyle recommendations and I discussed the Mediterranean diet with him.  We also discussed the national recommendation for 150 total minutes of exercise weekly as being a minimum.  He states that he has a plan to become more active this fall and lose weight as he is aware of his recent weight gain.

## 2022-01-05 NOTE — Assessment & Plan Note (Signed)
Vit D insufficiency noted on labs from 2020. Not currently on supplementation. Repeat vitamin D level at future lab appointment.

## 2022-01-10 ENCOUNTER — Encounter: Payer: Self-pay | Admitting: Internal Medicine

## 2022-01-10 ENCOUNTER — Telehealth: Payer: Self-pay | Admitting: Internal Medicine

## 2022-01-10 DIAGNOSIS — E785 Hyperlipidemia, unspecified: Secondary | ICD-10-CM

## 2022-01-10 DIAGNOSIS — Z0000 Encounter for general adult medical examination without abnormal findings: Secondary | ICD-10-CM

## 2022-01-10 NOTE — Telephone Encounter (Signed)
Seen 9/1 for annual physical exam.  His PCP is Dr. Lodema Hong and he requested that I discuss lab work with her before placing orders on 9/1.  She has requested the following orders attached to this encounter.  Darryl Christensen we please let Darryl Christensen know labs have been ordered?  Thank you.

## 2022-01-11 NOTE — Telephone Encounter (Signed)
Sent mychart message

## 2022-01-12 NOTE — Telephone Encounter (Signed)
Patient read my chart message

## 2022-03-02 ENCOUNTER — Other Ambulatory Visit (HOSPITAL_BASED_OUTPATIENT_CLINIC_OR_DEPARTMENT_OTHER): Payer: Self-pay

## 2022-03-02 MED ORDER — COMIRNATY 30 MCG/0.3ML IM SUSY
PREFILLED_SYRINGE | INTRAMUSCULAR | 0 refills | Status: AC
  Filled 2022-03-02: qty 0.3, 1d supply, fill #0

## 2022-03-02 MED ORDER — INFLUENZA VAC SPLIT QUAD 0.5 ML IM SUSY
PREFILLED_SYRINGE | INTRAMUSCULAR | 0 refills | Status: AC
  Filled 2022-03-02: qty 0.5, 1d supply, fill #0

## 2022-05-09 DIAGNOSIS — H5213 Myopia, bilateral: Secondary | ICD-10-CM | POA: Diagnosis not present

## 2022-05-09 DIAGNOSIS — H1045 Other chronic allergic conjunctivitis: Secondary | ICD-10-CM | POA: Diagnosis not present

## 2022-05-09 DIAGNOSIS — H16223 Keratoconjunctivitis sicca, not specified as Sjogren's, bilateral: Secondary | ICD-10-CM | POA: Diagnosis not present

## 2022-05-09 DIAGNOSIS — H33301 Unspecified retinal break, right eye: Secondary | ICD-10-CM | POA: Diagnosis not present

## 2022-10-26 ENCOUNTER — Ambulatory Visit: Payer: 59

## 2022-10-26 ENCOUNTER — Ambulatory Visit: Payer: 59 | Admitting: Podiatry

## 2022-10-26 DIAGNOSIS — S93501A Unspecified sprain of right great toe, initial encounter: Secondary | ICD-10-CM

## 2022-10-26 DIAGNOSIS — T1490XA Injury, unspecified, initial encounter: Secondary | ICD-10-CM

## 2022-10-26 NOTE — Patient Instructions (Signed)

## 2022-10-30 NOTE — Progress Notes (Signed)
Subjective: Chief Complaint  Patient presents with   Foot Injury    Right foot injury, hit it against something.This happen during the weekend. Patient is feel better. No treatment    47 year old male presents the office for above concerns.  He states that he has over the weekend and is feeling better but would like to have the toe checked. He does not report any other injuries.  No treatment or other concerns at this time.   Objective: AAO x3, NAD DP/PT pulses palpable bilaterally, CRT less than 3 seconds There is mild discomfort of the right hallux.  Trace edema.  No erythema or warmth.  No pain to metatarsals or other areas of the toes.  There are some slight discomfort on the MPJ.  Toe is rectus. No pain with calf compression, swelling, warmth, erythema  Assessment: 47 year old male with contusion right foot  Plan: -All treatment options discussed with the patient including all alternatives, risks, complications.  -X-rays obtained reviewed.  3 views of the foot were obtained.  Not able to appreciate any evidence of acute fracture.  Mild bunion is present. -Symptoms are improving and eye swelling continues to improve.  Discussed wearing stiffer soled shoes to help with immobilization. NSAIDs prn -Continue powersteps.  -Patient encouraged to call the office with any questions, concerns, change in symptoms.   Vivi Barrack DPM

## 2023-02-08 ENCOUNTER — Other Ambulatory Visit (HOSPITAL_BASED_OUTPATIENT_CLINIC_OR_DEPARTMENT_OTHER): Payer: Self-pay

## 2023-02-08 MED ORDER — INFLUENZA VIRUS VACC SPLIT PF (FLUZONE) 0.5 ML IM SUSY
0.5000 mL | PREFILLED_SYRINGE | Freq: Once | INTRAMUSCULAR | 0 refills | Status: AC
  Filled 2023-02-08: qty 0.5, 1d supply, fill #0

## 2023-02-08 MED ORDER — COVID-19 MRNA VAC-TRIS(PFIZER) 30 MCG/0.3ML IM SUSY
0.3000 mL | PREFILLED_SYRINGE | Freq: Once | INTRAMUSCULAR | 0 refills | Status: AC
  Filled 2023-02-08: qty 0.3, 1d supply, fill #0

## 2024-03-06 ENCOUNTER — Other Ambulatory Visit (HOSPITAL_BASED_OUTPATIENT_CLINIC_OR_DEPARTMENT_OTHER): Payer: Self-pay

## 2024-03-06 MED ORDER — FLUZONE 0.5 ML IM SUSY
0.5000 mL | PREFILLED_SYRINGE | Freq: Once | INTRAMUSCULAR | 0 refills | Status: AC
  Filled 2024-03-06: qty 0.5, 1d supply, fill #0
